# Patient Record
Sex: Male | Born: 1955 | Race: White | Hispanic: No | Marital: Married
Health system: Midwestern US, Community
[De-identification: ages and names within clinical notes are randomized; demographics above are authoritative.]

## PROBLEM LIST (undated history)

## (undated) DIAGNOSIS — R339 Retention of urine, unspecified: Secondary | ICD-10-CM

---

## 2014-01-15 NOTE — Progress Notes (Signed)
PHYSICAL THERAPY - DAILY TREATMENT NOTE    Patient Name: Seth Sandoval        Date: 01/15/2014  DOB: 1955/11/13    Patient DOB Verified: YES  Visit #:   1   of   12  Insurance: Payor: BLUE CROSS / Plan: [place] BL[place] PPO / Product Type: PPO /      In time: 2:00 Out time: 2:45   Total Treatment Time: 45     Medicare Time Tracking (below)   Total Timed Codes (min):  n/a 1:1 Treatment Time:  n/a     TREATMENT AREA/ DIAGNOSIS = Low back pain [724.2]    SUBJECTIVE  Pain Level (on 0 to 10 scale):  0  / 10   Medication Changes/New allergies or changes in medical history, any new surgeries or procedures?    NO    If yes, update Summary List   Subjective Functional Status/Changes:    No changes reported     See Eval       OBJECTIVE  Modalities Rationale:  decrease edema/ inflammation   decrease pain    increase tissue extensibility   increase muscle performance   decrease neural compromise  to improve patient's ability to  perform ADLs    ambulate   perform work   relaxation/ sleep      min  Estim, type/location:                                        att       unatt       w/US       w/ice      w/heat    min   Mechanical Traction: type/lbs                     pro     sup     int     cont      before manual      after manual    min   Ultrasound, settings/location:      min   Iontophoresis w/ dexamethasone, location:                                                 take home patch         in clinic    min   Ice       Heat    location/position:     min   Vasopneumatic Device, press/temp:     min   Other:     Skin assessment post-treatment (if applicable):      intact      redness- no adverse reaction     redness ??? adverse reaction:         10 min Therapeutic Exercise:    See flow sheet   Rationale:   increase ROM    increase strength    increase endurance    increase motor control    other  to improve patient???s ability to  perform ADLs    ambulate   perform work   relaxation/ sleep        min Manual Therapy:   Technique:  STMASTMTPRPROM Stretching  Jt manipulation Gr I  II   III  IV V  Treatment Area:  Other:   Rationale:  decrease pain    decrease TP  increase ROM/ mobility    increase tissue extensibility    decrease edema    reduce disc  postural correction    other     to improve patient's ability to  perform ADLs    ambulate   perform work   relaxation/ sleep       min Therapeutic Activity:   see flow sheet   Rationale: increase ROM    increase strength    increase balance/ proprioception    increase motor control    other   to improve patient???s ability to  perform ADLs    ambulate   perform work   relaxation/ sleep      min Neuromuscular Re-ed:   see flow sheet   Rationale: increase ROM    increase strength    increase balance/ proprioception    increase motor control   improve safety   other    to improve patient???s ability to perform ADLs    ambulate   perform work   relaxation/ sleep                min Gait Training:    To improve patients ability to:     perform ADLs    ambulate       min Patient Education:  Yes     Reviewed HEP     Progressed/Changed HEP based on:        Other Objective/Functional Measures:    See Eval       Post Treatment Pain Level (on 0 to 10) scale:   0  / 10     ASSESSMENT    See Eval      Patient will continue to benefit from skilled PT services to modify and progress therapeutic interventions, address functional mobility deficits, address ROM deficits, address strength deficits, analyze and address soft tissue restrictions, analyze and cue movement patterns, analyze and modify body mechanics/ergonomics and assess and modify postural abnormalities to attain remaining goals.   Progress toward goals / Updated goals:     decr pain   inc stability   inc balance  inc ROM inc strength   centralizing symptoms                     progressing  Function   progressing towards LTGs              progressing HEP       PLAN     Upgrade activities as tolerated YES Continue plan of care     Discharge due to :      Other:      Therapist: Debera Lat, PT    Date: 01/15/2014 Time: 2:56 PM        Future Appointments  Date Time Provider Department Center   01/18/2014 5:00 PM Leverne Humbles, PT [facility]   01/23/2014 5:00 PM Leverne Humbles, PT [facility]   01/25/2014 5:30 PM Leverne Humbles, PT [facility]   01/30/2014 5:00 PM Leverne Humbles, PT [facility]   02/01/2014 5:00 PM Leverne Humbles, PT [facility]   02/05/2014 5:00 PM Debera Lat, PT [facility]   02/07/2014 5:00 PM Debera Lat, PT [facility]   02/12/2014 5:00 PM Debera Lat, PT [facility]   02/14/2014 5:00 PM Debera Lat, PT  [facility]   02/19/2014 5:00 PM Debera Lat, PT [facility]   02/21/2014 5:00 PM Debera Lat, PT [facility] [facility]

## 2014-01-15 NOTE — Progress Notes (Signed)
[facility] ??? [facility]  [address], [place], [place] [id number] - Phone: [phone]  Fax: [phone]  PLAN OF CARE / STATEMENT OF MEDICAL NECESSITY FOR PHYSICAL THERAPY SERVICES  Patient Name: Seth BayleyRobert Sandoval DOB: 10/17/1955   Medical   Diagnosis: Low back pain [724.2] Treatment Diagnosis: L5-S1 Disc Herniation   Onset Date: 7/15     Referral Source: Fenton Foyon, Martin Vt, MD Start of Care [facility](SOC): 01/15/2014   Prior Hospitalization: See medical history Provider #: [id number]   Prior Level of Function: Hx L4-L5 HNP 15 years ago   Comorbidities: HTN   Medications: Verified on Patient Summary List   The Plan of Care and following information is based on the information from the initial evaluation.   ===========================================================================================  Assessment / key information:  Seth Sandoval is a 58 y.o.  yo male with Dx of Low back pain [724.2].  Patient reports onset of symptoms after donning CAM boot for 6 weeks due to right toe fracture.  Patient first experienced right lumbar and LE pain about 3 weeks ago when unable to get out of car due to pain.  Followed up with pain specialist and received MRI revealing disc herniation at L5-S1, per patient.  Patient received first of 2 lumbar epidural steroid injections on 01/04/14 with good symptom relief.  Denies any radiculopathy at this time. Patient currently rates pain as 9/10 at worst, 0/10 at best, primarily located at right lumbar spine.  Patient complains of difficulty and increase pain with standing and walking.  Objective Findings:  Lumbar ROM: Flx = decreased 50% , Ext = decreased 50%, Lat Flx R = 50%, L = 75%, Rot: R = 50%, L = 25%.  Manual Muscle Testing: bilateral LE strength grossly 4+/5.  Special Test: negative SLR and slump test. Oswestry Score=43%.  Pt instructed in HEP, ergonomics, lifting techniques and postural education and will f/u in clinic for PT.   ===========================================================================================  Problem List: pain affecting function, decrease ROM, decrease strength, edema affecting function, impaired gait/ balance, decrease ADL/ functional abilitiies and decrease activity tolerance   Treatment Plan may include any combination of the following: Therapeutic exercise, Therapeutic activities, Neuromuscular re-education, Physical agent/modality, Gait/balance training, Manual therapy and Patient education  Patient / Family readiness to learn indicated by: asking questions, trying to perform skills and interest  Persons(s) to be included in education: patient (P)  Barriers to Learning/Limitations: no  Measures taken:    Patient Goal (s): "Walk without pain. Go back to the gym."   Patient self reported health status: good  Rehabilitation Potential: good  ? Short Term Goals: To be accomplished in  1-2  weeks:  1. Patient will decrease Oswestry Score by 8% to improve activity tolerance.  2. Patient will achieve +2 on GROC to increase activity tolerance.  ? Long Term Goals: To be accomplished in  3-4  weeks:  1.  Patient will achieve +4 on GROC to increase activity tolerance.  2.  Patient will decrease Oswestry Score by 16% to improve activity tolerance.  3.  Patient will report 75% improvement in symptoms to increase walking tolerance.  Frequency / Duration:   Patient to be seen  2-3  times per week for 3-4  weeks:  Patient / Caregiver education and instruction: activity modification and exercises  G-Codes (GP): n/a   Therapist Signature: Debera LatLeah Hilmar Moldovan, MSPT Date: 01/15/2014   Certification Period: n/a Time: 2:58 PM   ===========================================================================================  I certify  that the above Physical Therapy Services are being furnished while the patient is under my care.  I agree with the treatment plan and certify that this therapy is necessary.     Physician Signature:        Date:       Time:     Please sign and return to [facility] or you may fax the signed copy to [phone].  Thank you.

## 2014-01-18 NOTE — Progress Notes (Signed)
PHYSICAL THERAPY - DAILY TREATMENT NOTE    Patient Name: Seth Sandoval        Date: 01/18/2014  DOB: 09-04-1955    Patient DOB Verified: YES  Visit #:   2   of   12  Insurance: Payor: BLUE CROSS / Plan: [place] BLUE [place] OF [place] PPO / Product Type: PPO /      In time: 4:50 Out time: 5:35   Total Treatment Time: 45     Medicare Time Tracking (below)   Total Timed Codes (min):  - 1:1 Treatment Time:  -     TREATMENT AREA/ DIAGNOSIS = Low back pain [724.2]    SUBJECTIVE  Pain Level (on 0 to 10 scale):  3  / 10   Medication Changes/New allergies or changes in medical history, any new surgeries or procedures?    NO    If yes, update Summary List   Subjective Functional Status/Changes:    No changes reported     Functional improvement  No leg pain  Functional limitation  R LBP     OBJECTIVE  Modalities Rationale:  decrease edema/ inflammation   decrease pain    increase tissue extensibility   increase muscle performance   decrease neural compromise  to improve patient's ability to  perform ADLs    ambulate   perform work   relaxation/ sleep      min  Estim, type/location:                                        att       unatt       w/US       w/ice      w/heat    min   Mechanical Traction: type/lbs                     pro     sup     int     cont      before manual      after manual    min   Ultrasound, settings/location:      min   Iontophoresis w/ dexamethasone, location:                                                 take home patch         in clinic    min   Ice       Heat    location/position:     min   Vasopneumatic Device, press/temp:     min   Other:     Skin assessment post-treatment (if applicable):      intact      redness- no adverse reaction     redness ??? adverse reaction:        15  min Therapeutic Exercise:    See flow sheet   Rationale:   increase ROM    increase strength    increase endurance    increase motor control    other  to improve patient???s ability to  perform ADLs    ambulate   perform work    relaxation/ sleep      15 min Manual Therapy: Technique:  DTM to L/S and PA mobs (gd IV to T/S), OP with REIL    Other:   Rationale:  decrease pain    decrease TP  increase ROM/ mobility    increase tissue extensibility    decrease edema    reduce disc  postural correction    other     to improve patient's ability to  perform ADLs    ambulate   perform work   relaxation/ sleep      15 min Therapeutic Activity:   education on anatomy/physiology and posture/body mechanics   Rationale: increase ROM    increase strength    increase balance/ proprioception    increase motor control    other   to improve patient???s ability to  perform ADLs    ambulate   perform work   relaxation/ sleep      min Neuromuscular Re-ed:   see flow sheet   Rationale: increase ROM    increase strength    increase balance/ proprioception    increase motor control   improve safety   other    to improve patient???s ability to perform ADLs    ambulate   perform work   relaxation/ sleep                min Gait Training:    To improve patients ability to:     perform ADLs    ambulate       min Patient Education:  Yes     Reviewed HEP     Progressed/Changed HEP based on:        Other Objective/Functional Measures:  Today, pt with R LBP and no LE pain  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   Progress toward goals / Updated goals:     decr pain   inc stability   inc balance  inc ROM inc strength    centralizing symptoms                     progressing  Function   progressing towards LTGs              progressing HEP       PLAN    Upgrade activities as tolerated YES Continue plan of care     Discharge due to :      Other:      Therapist: Leverne Humbles, PT    Date: 01/18/2014 Time: 6:11 PM        Future Appointments  Date Time Provider Department Center   01/23/2014 5:00 PM Leverne Humbles, PT [facility]   01/26/2014 12:00 PM Leverne Humbles, PT [facility]   01/30/2014 5:00 PM Leverne Humbles, PT [facility]   02/01/2014 5:00 PM Leverne Humbles, PT [facility]   02/05/2014 5:00 PM Debera Lat, PT [facility]   02/07/2014 5:00 PM Debera Lat, PT [facility]   02/12/2014  5:00 PM Debera Lat, PT [facility]   02/14/2014 5:00 PM Debera Lat, PT [facility]   02/19/2014 5:00 PM Debera Lat, PT [facility]   02/21/2014 5:00 PM Debera Lat, PT [facility]

## 2014-01-23 NOTE — Progress Notes (Signed)
PHYSICAL THERAPY - DAILY TREATMENT NOTE    Patient Name: Seth Sandoval        Date: 01/23/2014  DOB: Sep 30, 1955    Patient DOB Verified: YES  Visit #:   3   of   12  Insurance: Payor: BLUE CROSS / Plan: [place] BL[place] PPO / Product Type: PPO /      In time: 5 Out time: 5:30   Total Treatment Time: 30     Medicare Time Tracking (below)   Total Timed Codes (min):  - 1:1 Treatment Time:  -     TREATMENT AREA/ DIAGNOSIS = Low back pain [724.2]    SUBJECTIVE  Pain Level (on 0 to 10 scale):  0  / 10   Medication Changes/New allergies or changes in medical history, any new surgeries or procedures?    NO    If yes, update Summary List   Subjective Functional Status/Changes:    No changes reported     Functional improvement i feel good   Functional limitation-      OBJECTIVE  Modalities Rationale:  decrease edema/ inflammation   decrease pain    increase tissue extensibility   increase muscle performance   decrease neural compromise  to improve patient's ability to  perform ADLs    ambulate   perform work   relaxation/ sleep      min  Estim, type/location:                                        att       unatt       w/US       w/ice      w/heat    min   Mechanical Traction: type/lbs                     pro     sup     int     cont      before manual      after manual    min   Ultrasound, settings/location:      min   Iontophoresis w/ dexamethasone, location:                                                 take home patch         in clinic    min   Ice       Heat    location/position:     min   Vasopneumatic Device, press/temp:     min   Other:     Skin assessment post-treatment (if applicable):      intact      redness- no adverse reaction     redness ??? adverse reaction:        15  min Therapeutic Exercise:    See flow sheet   Rationale:   increase ROM    increase strength    increase endurance    increase motor control    other  to improve patient???s ability to  perform ADLs    ambulate   perform work    relaxation/ sleep     10  min Manual Therapy: Technique:  DTM to L/S and T/S, Gd IV PA mobs to lower/mid T/S  Other:   Rationale:  decrease pain    decrease TP  increase ROM/ mobility    increase tissue extensibility    decrease edema    reduce disc  postural correction    other     to improve patient's ability to  perform ADLs    ambulate   perform work   relaxation/ sleep      5 min Therapeutic Activity:   posture/body mechanics training   Rationale: increase ROM    increase strength    increase balance/ proprioception    increase motor control    other   to improve patient???s ability to  perform ADLs    ambulate   perform work   relaxation/ sleep      min Neuromuscular Re-ed:   see flow sheet   Rationale: increase ROM    increase strength    increase balance/ proprioception    increase motor control   improve safety   other    to improve patient???s ability to perform ADLs    ambulate   perform work   relaxation/ sleep                min Gait Training:    To improve patients ability to:     perform ADLs    ambulate       min Patient Education:  Yes     Reviewed HEP     Progressed/Changed HEP based on:        Other Objective/Functional Measures:    No LBP today  Felt great after last treatment, able to dance at a wedding on saturday   Post Treatment Pain Level (on 0 to 10) scale:   0  / 10     ASSESSMENT    See Progress Note/Recertification      Patient will continue to benefit from skilled PT services to modify and progress therapeutic interventions, address functional mobility deficits, address ROM deficits and address strength deficits to attain remaining goals.   Progress toward goals / Updated goals:     decr pain   inc stability   inc balance  inc ROM inc strength   centralizing symptoms                     progressing  Function   progressing towards LTGs              progressing HEP       PLAN    Upgrade activities as tolerated YES Continue plan of care     Discharge due to :      Other:       Therapist: Leverne Humbles, PT    Date: 01/23/2014 Time: 5:35 PM        Future Appointments  Date Time Provider Department Center   01/30/2014 5:00 PM Leverne Humbles, PT [facility]   02/01/2014 5:00 PM Leverne Humbles, PT [facility]   02/05/2014 5:00 PM Debera Lat, PT [facility]   02/07/2014 5:00 PM Debera Lat, PT [facility]   02/12/2014 5:00 PM Debera Lat, PT [facility]   02/14/2014 5:00 PM Debera Lat, PT [facility]   02/19/2014 5:00 PM Debera Lat, PT [facility]   02/21/2014 5:00 PM Debera Lat, PT [facility]

## 2014-02-01 NOTE — Progress Notes (Signed)
PHYSICAL THERAPY - DAILY TREATMENT NOTE    Patient Name: Seth Sandoval        Date: 02/01/2014  DOB: 1956-04-14    Patient DOB Verified: YES  Visit #:   4   of   12  Insurance: Payor: BLUE CROSS / Plan: [place] / Product Type: PPO /      In time: 5 Out time: 5:40   Total Treatment Time: 40     Medicare Time Tracking (below)   Total Timed Codes (min):  - 1:1 Treatment Time:  -     TREATMENT AREA/ DIAGNOSIS = Low back pain [724.2]    SUBJECTIVE  Pain Level (on 0 to 10 scale):  0  / 10   Medication Changes/New allergies or changes in medical history, any new surgeries or procedures?    NO    If yes, update Summary List   Subjective Functional Status/Changes:    No changes reported     Functional improvement i feel pretty good   Functional limitation       OBJECTIVE  Modalities Rationale:  decrease edema/ inflammation   decrease pain    increase tissue extensibility   increase muscle performance   decrease neural compromise  to improve patient's ability to  perform ADLs    ambulate   perform work   relaxation/ sleep      min  Estim, type/location:                                        att       unatt       w/US       w/ice      w/heat    min   Mechanical Traction: type/lbs                     pro     sup     int     cont      before manual      after manual    min   Ultrasound, settings/location:      min   Iontophoresis w/ dexamethasone, location:                                                 take home patch         in clinic    min   Ice       Heat    location/position:     min   Vasopneumatic Device, press/temp:     min   Other:     Skin assessment post-treatment (if applicable):      intact      redness- no adverse reaction     redness ??? adverse reaction:        30  min Therapeutic Exercise:    See flow sheet   Rationale:   increase ROM    increase strength    increase endurance    increase motor control    other  to improve patient???s ability to  perform ADLs    ambulate   perform work    relaxation/ sleep     10 min Manual Therapy: Technique:  DTM to L/S and T/S, Gd iV PA mobs to T/S  Other:   Rationale:  decrease pain    decrease TP  increase ROM/ mobility    increase tissue extensibility    decrease edema    reduce disc  postural correction    other     to improve patient's ability to  perform ADLs    ambulate   perform work   relaxation/ sleep       min Therapeutic Activity:   see flow sheet   Rationale: increase ROM    increase strength    increase balance/ proprioception    increase motor control    other   to improve patient???s ability to  perform ADLs    ambulate   perform work   relaxation/ sleep      min Neuromuscular Re-ed:   see flow sheet   Rationale: increase ROM    increase strength    increase balance/ proprioception    increase motor control   improve safety   other    to improve patient???s ability to perform ADLs    ambulate   perform work   relaxation/ sleep                min Gait Training:    To improve patients ability to:     perform ADLs    ambulate       min Patient Education:  Yes     Reviewed HEP     Progressed/Changed HEP based on:        Other Objective/Functional Measures:    Though his MRI seems to show radic symptoms were caused by stenosis, and abolished with ESI, the patient is tolerating full L/S extension (REIL) without any pain  Educated on posture with therex, to elongate his core while standing   Post Treatment Pain Level (on 0 to 10) scale:   0  / 10     ASSESSMENT    See Progress Note/Recertification      Patient will continue to benefit from skilled PT services to modify and progress therapeutic interventions, address functional mobility deficits, address ROM deficits and address strength deficits to attain remaining goals.   Progress toward goals / Updated goals:     decr pain   inc stability   inc balance  inc ROM inc strength   centralizing symptoms                     progressing  Function   progressing towards LTGs              progressing HEP        PLAN    Upgrade activities as tolerated YES Continue plan of care     Discharge due to :      Other:      Therapist: Leverne Humbles, PT    Date: 02/01/2014 Time: 6:37 PM        Future Appointments  Date Time Provider Department Center   02/05/2014 5:00 PM Debera Lat, PT [facility]   02/07/2014 5:00 PM Debera Lat, PT [facility]   02/12/2014 5:00 PM Debera Lat, PT [facility]   02/14/2014 5:00 PM Debera Lat, PT [facility]   02/19/2014 5:00 PM Debera Lat, PT [facility]   02/21/2014 5:00 PM Debera Lat, PT [facility]

## 2014-02-05 NOTE — Progress Notes (Signed)
PHYSICAL THERAPY - DAILY TREATMENT NOTE    Patient Name: Seth Sandoval        Date: 02/05/2014  DOB: 1956-01-29    Patient DOB Verified: YES  Visit #:   5   of   12  Insurance: Payor: BLUE CROSS / Plan: [place]  PPO / Product Type: PPO /      In time: 5:05 Out time: 5:45   Total Treatment Time: 40     Medicare Time Tracking (below)   Total Timed Codes (min):  n/a 1:1 Treatment Time:  n/a     TREATMENT AREA/ DIAGNOSIS = Low back pain [724.2]    SUBJECTIVE  Pain Level (on 0 to 10 scale):  0  / 10   Medication Changes/New allergies or changes in medical history, any new surgeries or procedures?    NO    If yes, update Summary List   Subjective Functional Status/Changes:    No changes reported     Functional Improvement:ADLs painfree  Functional Limitation:higher impact activities       OBJECTIVE  Modalities Rationale:  decrease edema/ inflammation   decrease pain    increase tissue extensibility   increase muscle performance   decrease neural compromise  to improve patient's ability to  perform ADLs    ambulate   perform work   relaxation/ sleep      min  Estim, type/location:                                        att       unatt       w/US       w/ice      w/heat    min   Mechanical Traction: type/lbs                     pro     sup     int     cont      before manual      after manual    min   Ultrasound, settings/location:      min   Iontophoresis w/ dexamethasone, location:                                                 take home patch         in clinic    min   Ice       Heat    location/position:     min   Vasopneumatic Device, press/temp:     min   Other:     Skin assessment post-treatment (if applicable):      intact      redness- no adverse reaction     redness ??? adverse reaction:         15 min Therapeutic Exercise:    See flow sheet   Rationale:   increase ROM    increase strength    increase endurance    increase motor control    other   to improve patient???s ability to  perform ADLs    ambulate   perform work   relaxation/ sleep      10 min Manual Therapy: Technique:  STMASTMTPRPROM Stretching  Jt manipulation Gr I  II   III  IV V  Treatment Area: Aug STM lumbar paraspinals; Prone T/S PA mobs grade III   Rationale:  decrease pain    decrease TP  increase ROM/ mobility    increase tissue extensibility    decrease edema    reduce disc  postural correction    other     to improve patient's ability to  perform ADLs    ambulate   perform work   relaxation/ sleep       min Therapeutic Activity:   see flow sheet   Rationale: increase ROM    increase strength    increase balance/ proprioception    increase motor control    other   to improve patient???s ability to  perform ADLs    ambulate   perform work   relaxation/ sleep     15 min Neuromuscular Re-ed:   see flow sheet   Rationale: increase ROM    increase strength    increase balance/ proprioception    increase motor control   improve safety   other    to improve patient???s ability to perform ADLs    ambulate   perform work   relaxation/ sleep                min Gait Training:    To improve patients ability to:     perform ADLs    ambulate       min Patient Education:  Yes     Reviewed HEP     Progressed/Changed HEP based on:        Other Objective/Functional Measures:    Patient reporting no complaints of pain with ADLs.  Slowly increasing weight training at gym without symptom exacerbation.   Post Treatment Pain Level (on 0 to 10) scale:   0  / 10     ASSESSMENT    See Progress Note/Recertification      Patient will continue to benefit from skilled PT services to modify and progress therapeutic interventions, address functional mobility deficits, address ROM deficits, address strength deficits, analyze and address soft tissue restrictions, analyze and cue movement patterns, analyze and modify body mechanics/ergonomics and assess and modify postural abnormalities to attain remaining goals.    Progress toward goals / Updated goals:      decr pain   inc stability   inc balance  inc ROM inc strength   centralizing symptoms                     progressing  Function   progressing towards LTGs              progressing HEP       PLAN    Upgrade activities as tolerated YES Continue plan of care     Discharge due to :      Other:      Therapist: Debera Lat, PT    Date: 02/05/2014 Time: 5:37 PM        Future Appointments  Date Time Provider Department Center   02/07/2014 5:00 PM Debera Lat, PT [facility]   02/12/2014 5:00 PM Debera Lat, PT [facility]   02/14/2014 5:00 PM Debera Lat, PT [facility]   02/19/2014 5:00 PM Debera Lat, PT [facility]   02/21/2014 5:00 PM Debera Lat, PT [facility]

## 2014-02-08 NOTE — Progress Notes (Signed)
PHYSICAL THERAPY - DAILY TREATMENT NOTE    Patient Name: Seth Sandoval        Date: 02/08/2014  DOB: 08-29-1955    Patient DOB Verified: YES  Visit #:   6   of   12  Insurance: Payor: BLUE CROSS / Plan: [place] PPO / Product Type: PPO /      In time: 3:35 Out time: 4:20   Total Treatment Time: 45     Medicare Time Tracking (below)   Total Timed Codes (min):  - 1:1 Treatment Time:  -     TREATMENT AREA/ DIAGNOSIS = Low back pain [724.2]    SUBJECTIVE  Pain Level (on 0 to 10 scale):  0  / 10   Medication Changes/New allergies or changes in medical history, any new surgeries or procedures?    NO    If yes, update Summary List   Subjective Functional Status/Changes:    No changes reported     Functional improvement no pain lately, even working    Functional limitation       OBJECTIVE  Modalities Rationale:  decrease edema/ inflammation   decrease pain    increase tissue extensibility   increase muscle performance   decrease neural compromise  to improve patient's ability to  perform ADLs    ambulate   perform work   relaxation/ sleep      min  Estim, type/location:                                        att       unatt       w/US       w/ice      w/heat    min   Mechanical Traction: type/lbs                     pro     sup     int     cont      before manual      after manual    min   Ultrasound, settings/location:      min   Iontophoresis w/ dexamethasone, location:                                                 take home patch         in clinic    min   Ice       Heat    location/position:     min   Vasopneumatic Device, press/temp:     min   Other:     Skin assessment post-treatment (if applicable):      intact      redness- no adverse reaction     redness ??? adverse reaction:        45  min Therapeutic Exercise:    See flow sheet   Rationale:   increase ROM    increase strength    increase endurance    increase motor control    other   to improve patient???s ability to  perform ADLs    ambulate   perform work   relaxation/ sleep       min Manual Therapy: Technique:  STMASTMTPRPROM Stretching  Jt manipulation Gr I  II   III  IV V  Treatment Area:    Other:   Rationale:  decrease pain    decrease TP  increase ROM/ mobility    increase tissue extensibility    decrease edema    reduce disc  postural correction    other     to improve patient's ability to  perform ADLs    ambulate   perform work   relaxation/ sleep       min Therapeutic Activity:   see flow sheet   Rationale: increase ROM    increase strength    increase balance/ proprioception    increase motor control    other   to improve patient???s ability to  perform ADLs    ambulate   perform work   relaxation/ sleep      min Neuromuscular Re-ed:   see flow sheet   Rationale: increase ROM    increase strength    increase balance/ proprioception    increase motor control   improve safety   other    to improve patient???s ability to perform ADLs    ambulate   perform work   relaxation/ sleep                min Gait Training:    To improve patients ability to:     perform ADLs    ambulate       min Patient Education:  Yes     Reviewed HEP     Progressed/Changed HEP based on:        Other Objective/Functional Measures:    No pain lately  Doing well at gym  Added lifts and chops for core stability  Tolerating full L/S ext without symptoms    Post Treatment Pain Level (on 0 to 10) scale:   0  / 10     ASSESSMENT    See Progress Note/Recertification      Patient will continue to benefit from skilled PT services to modify and progress therapeutic interventions, address functional mobility deficits, address ROM deficits, address strength deficits and analyze and address soft tissue restrictions to attain remaining goals.   Progress toward goals / Updated goals:     decr pain   inc stability   inc balance  inc ROM inc strength   centralizing symptoms                     progressing  Function    progressing towards LTGs              progressing HEP       PLAN    Upgrade activities as tolerated YES Continue plan of care     Discharge due to :      Other:      Therapist: Leverne Humbles, PT    Date: 02/08/2014 Time: 4:50 PM        Future Appointments  Date Time Provider Department Center   02/12/2014 5:00 PM Debera Lat, PT [facility]   02/14/2014 5:00 PM Debera Lat, PT [facility]   02/19/2014 5:00 PM Debera Lat, PT [facility]   02/21/2014 5:00 PM Debera Lat, PT [facility]

## 2014-02-12 NOTE — Progress Notes (Signed)
PHYSICAL THERAPY - DAILY TREATMENT NOTE    Patient Name: Seth Sandoval        Date: 02/12/2014  DOB: 10/24/1955    Patient DOB Verified: YES  Visit #:   7   of   12  Insurance: Payor: BLUE CROSS / Plan: [place]UE [place] PPO / Product Type: PPO /      In time: 5:00 Out time: 5:45   Total Treatment Time: 45     Medicare Time Tracking (below)   Total Timed Codes (min):  n/a 1:1 Treatment Time:  n/a     TREATMENT AREA/ DIAGNOSIS = Low back pain [724.2]    SUBJECTIVE  Pain Level (on 0 to 10 scale):  0  / 10   Medication Changes/New allergies or changes in medical history, any new surgeries or procedures?    NO    If yes, update Summary List   Subjective Functional Status/Changes:    No changes reported     Functional Improvement:painfree ADLs  Functional Limitation:gym routine       OBJECTIVE  Modalities Rationale:  decrease edema/ inflammation   decrease pain    increase tissue extensibility   increase muscle performance   decrease neural compromise  to improve patient's ability to  perform ADLs    ambulate   perform work   relaxation/ sleep      min  Estim, type/location:                                        att       unatt       w/US       w/ice      w/heat    min   Mechanical Traction: type/lbs                     pro     sup     int     cont      before manual      after manual    min   Ultrasound, settings/location:      min   Iontophoresis w/ dexamethasone, location:                                                 take home patch         in clinic    min   Ice       Heat    location/position:     min   Vasopneumatic Device, press/temp:     min   Other:     Skin assessment post-treatment (if applicable):      intact      redness- no adverse reaction     redness ??? adverse reaction:         15 min Therapeutic Exercise:    See flow sheet   Rationale:   increase ROM    increase strength    increase endurance    increase motor control    other   to improve patient???s ability to  perform ADLs    ambulate   perform work   relaxation/ sleep       min Manual Therapy: Technique:  STMASTMTPRPROM Stretching  Jt manipulation Gr I  II   III  IV V  Treatment Area:  Other:   Rationale:  decrease pain    decrease TP  increase ROM/ mobility    increase tissue extensibility    decrease edema    reduce disc  postural correction    other     to improve patient's ability to  perform ADLs    ambulate   perform work   relaxation/ sleep       min Therapeutic Activity:   see flow sheet   Rationale: increase ROM    increase strength    increase balance/ proprioception    increase motor control    other   to improve patient???s ability to  perform ADLs    ambulate   perform work   relaxation/ sleep     30 min Neuromuscular Re-ed:   see flow sheet   Rationale: increase ROM    increase strength    increase balance/ proprioception    increase motor control   improve safety   other    to improve patient???s ability to perform ADLs    ambulate   perform work   relaxation/ sleep                min Gait Training:    To improve patients ability to:     perform ADLs    ambulate       min Patient Education:  Yes     Reviewed HEP     Progressed/Changed HEP based on:        Other Objective/Functional Measures:  Focus on mobility exercises and proper form for gym routine. Reviewed squats and plank. Encouraged D/C elastic back brace.  Added Kensio tape and lumbar spine for feedback with poor posture. Updated HEP to include increased spinal mobility exercises. Discussed D/C next visit if progressing well with gym program.   Post Treatment Pain Level (on 0 to 10) scale:   0  / 10     ASSESSMENT    See Progress Note/Recertification      Patient will continue to benefit from skilled PT services to modify and progress therapeutic interventions, address functional mobility deficits, address ROM deficits, address strength deficits, analyze and address soft  tissue restrictions, analyze and cue movement patterns, analyze and modify body mechanics/ergonomics and assess and modify postural abnormalities to attain remaining goals.   Progress toward goals / Updated goals:      decr pain   inc stability   inc balance  inc ROM inc strength   centralizing symptoms                     progressing  Function   progressing towards LTGs              progressing HEP       PLAN    Upgrade activities as tolerated YES Continue plan of care     Discharge due to :      Other:      Therapist: Debera Lat, PT    Date: 02/12/2014 Time: 5:49 PM        Future Appointments  Date Time Provider Department Center   02/19/2014 5:00 PM Debera Lat, PT [facility]   02/21/2014 5:00 PM Debera Lat, PT [facility]

## 2014-02-19 NOTE — Progress Notes (Signed)
[facility] PHYSICAL THERAPY AT [facility]  [address] [place] [address]   Phone: [phone]  Fax: [phone]  DISCHARGE SUMMARY  Patient Name: KALDEN WANKE DOB: 01/18/1956   Treatment/Medical Diagnosis: Low back pain [724.2]   Referral Source: Woodfin Ganja, MD     Date of Initial Visit: 01/15/14 Attended Visits: 8 Missed Visits: 1     SUMMARY OF TREATMENT  Treatment consisting of manual therapy and therex to improve back pain.  CURRENT STATUS  Patient progressing well.  No complaints of back pain or radiculopathy. Patient demonstrating good body mechanics and slowly returning to modified gym routine. Discharge to HEP to maintain functional improvements.    Goal/Measure of Progress Goal Met?   1.  +4 GROC   Status at last Eval: n/a Current Status: +6 yes   2.  Oswestry decrease by 16%   Status at last Eval: 43% Current Status: 4% yes   3.  75% improvement in symptoms   Status at last Eval: n/a Current Status: 90% improved yes   4.  n/a   Status at last Eval: n/a Current Status: n/a n/a     RECOMMENDATIONS  Discontinue therapy. Progressing towards or have reached established goals.  If you have any questions/comments please contact us directly at [phone].   Thank you for allowing [place] to assist in the care of your patient.    Therapist Signature: Otho Bellows, PT Date: 02/19/14     Time: 5:44 PM

## 2014-02-19 NOTE — Progress Notes (Signed)
PHYSICAL THERAPY - DAILY TREATMENT NOTE    Patient Name: Seth Sandoval        Date: 02/19/2014  DOB: 1956-05-09    Patient DOB Verified: YES  Visit #:   8   of   8  Insurance: Payor: BLUE CROSS / Plan: [facility] [place] [place] [facility] [place] PPO / Product Type: PPO /      In time: 5:00 Out time: 5:30   Total Treatment Time: 30     Medicare Time Tracking (below)   Total Timed Codes (min):  n/a 1:1 Treatment Time:  n/a     TREATMENT AREA/ DIAGNOSIS = Low back pain [724.2]    SUBJECTIVE  Pain Level (on 0 to 10 scale):  0  / 10   Medication Changes/New allergies or changes in medical history, any new surgeries or procedures?    NO    If yes, update Summary List   Subjective Functional Status/Changes:    No changes reported     Functional Improvement:ADLs  Functional Limitation:heavy lifting       OBJECTIVE  Modalities Rationale:  decrease edema/ inflammation   decrease pain    increase tissue extensibility   increase muscle performance   decrease neural compromise  to improve patient's ability to  perform ADLs    ambulate   perform work   relaxation/ sleep      min  Estim, type/location:                                        att       unatt       w/US       w/ice      w/heat    min   Mechanical Traction: type/lbs                     pro     sup     int     cont      before manual      after manual    min   Ultrasound, settings/location:      min   Iontophoresis w/ dexamethasone, location:                                                 take home patch         in clinic    min   Ice       Heat    location/position:     min   Vasopneumatic Device, press/temp:     min   Other:     Skin assessment post-treatment (if applicable):      intact      redness- no adverse reaction     redness ??? adverse reaction:         30 min Therapeutic Exercise:    See flow sheet   Rationale:   increase ROM    increase strength    increase endurance    increase motor control    other  to improve patient???s ability to  perform ADLs    ambulate   perform work    relaxation/ sleep       min Manual Therapy: Technique:  STMASTMTPRPROM Stretching  Jt manipulation Gr I  II   III  IV V  Treatment Area:  Other:   Rationale:  decrease pain    decrease TP  increase ROM/ mobility    increase tissue extensibility    decrease edema    reduce disc  postural correction    other     to improve patient's ability to  perform ADLs    ambulate   perform work   relaxation/ sleep       min Therapeutic Activity:   see flow sheet   Rationale: increase ROM    increase strength    increase balance/ proprioception    increase motor control    other   to improve patient???s ability to  perform ADLs    ambulate   perform work   relaxation/ sleep      min Neuromuscular Re-ed:   see flow sheet   Rationale: increase ROM    increase strength    increase balance/ proprioception    increase motor control   improve safety   other    to improve patient???s ability to perform ADLs    ambulate   perform work   relaxation/ sleep                min Gait Training:    To improve patients ability to:     perform ADLs    ambulate       min Patient Education:  Yes     Reviewed HEP     Progressed/Changed HEP based on:        Other Objective/Functional Measures:  See D/C Summary.  Pain free lumbar AROM, limited 50% lumbar sidebending bilaterally.   Post Treatment Pain Level (on 0 to 10) scale:   0  / 10     ASSESSMENT    See Progress Note/Recertification      Patient will continue to benefit from skilled PT services to instruct in home and community integration to attain remaining goals.   Progress toward goals / Updated goals:      decr pain   inc stability   inc balance  inc ROM inc strength   centralizing symptoms                     progressing  Function   progressing towards LTGs              progressing HEP       PLAN    Upgrade activities as tolerated YES Continue plan of care     Discharge due to : Progress toward goals     Other:      Therapist: Debera Lat, PT    Date: 02/19/2014 Time: 5:38 PM         Future Appointments  Date Time Provider Department Center   02/21/2014 5:00 PM Debera Lat, PT [facility]

## 2015-10-15 LAB — PSA, DIAGNOSTIC (PROSTATE SPECIFIC AG): Prostate Specific Ag: 2.5 ng/mL (ref 0.0–4.0)

## 2016-06-03 ENCOUNTER — Ambulatory Visit
Admit: 2016-06-03 | Discharge: 2016-06-03 | Payer: PRIVATE HEALTH INSURANCE | Attending: Urology | Primary: Student in an Organized Health Care Education/Training Program

## 2016-06-03 DIAGNOSIS — R339 Retention of urine, unspecified: Secondary | ICD-10-CM

## 2016-06-03 LAB — AMB POC URINALYSIS DIP STICK AUTO W/O MICRO
Bilirubin (UA POC): NEGATIVE
Glucose (UA POC): NEGATIVE
Ketones (UA POC): NEGATIVE
Nitrites (UA POC): NEGATIVE
Specific gravity (UA POC): 1.025 (ref 1.001–1.035)
Urobilinogen (UA POC): 0.2 (ref 0.2–1)
pH (UA POC): 5.5 (ref 4.6–8.0)

## 2016-06-03 LAB — AMB POC PVR, MEAS,POST-VOID RES,US,NON-IMAGING: PVR: 459 cc

## 2016-06-03 NOTE — Progress Notes (Signed)
Edson SnowballRobert J Wollschlager is a 61 y.o. male is here for a catheter change per Dr. Allena EaringeLong order.    Dr. Allena EaringeLong was present in the clinic as incident to.     There were no vitals taken for this visit.     Procedure:   The genital and perineal areas were cleansed with antiseptic solution.  With clean gloves, I applied sterile lubricant liberally to the catheter tip, lubricating at least six inches of the catheter. Antiseptic solution was used on the cotton balls.  The entire glans was cleansed again.   The penis was held at 90-degree angle. The catheter was advanced into the meatus.  The sphincter was allowed to relax. The penis was lowered and the catheter was advanced.  Balloon was inflated using 10cc of sterile water.   Catheter was connected to drainage system.    Follow up appointment was made.      Catheter size:  24F  Type:  Coude  Material:  Latex    Urine was:   Cloudy    Specimen was obtained:   Yes  Drainage bag:   Leg and Night    Encounter Diagnosis   Name Primary?   ??? Urinary retention Yes       Orders Placed This Encounter   ??? Foley Insert /Change Simple   ??? AMB POC URINALYSIS DIP STICK AUTO W/O MICRO       Tomma RakersPatricia K Inell Mimbs, LPN

## 2016-06-03 NOTE — Progress Notes (Signed)
ASSESSMENT:     06/03/2016    Encounter Diagnoses     ICD-10-CM ICD-9-CM   1. Urinary retention R33.9 788.20   2. Benign prostatic hyperplasia with incomplete bladder emptying N40.1 600.01    R39.14 788.21       PLAN:       RTC 1 week for VT, am appt with [facility] then 1 month with me, PVR and uroflow; sooner if difficulty  All questions answered.    A copy of today's note will be sent to the patient's primary care physician, PROVIDER UNKNOWN Thank you for the opportunity to participate in the care of this nice patient.      SUBJECTIVE:     CC:   Chief Complaint   Patient presents with   ??? Urinary Retention     Patient states that on 1/6/18He had urge to urinate and was unable to. Per PCP took Azo and increased fluid intake was still unable to urinate went to ER and they inserted catheter drained 1200. PCP removed catheter on 06/02/16 he still feels like he isnt urinating well.        HPI:    Seth Sandoval is a 61 y.o. male  who presents for work up and management of urinary retention.    He was seen in the ER when he felt he could not urinate and foley was placed without difficulty with return of 1200cc urine. 3 days later the foley was removed and the patient now feels that he is not urinating well. He has sensation of incomplete bladder emptying and dribbling stream. Prior to this episode was having some nocturia and decreased FOS but "not like this". He was started on flomax nightly which has subsequently been doubled, as well as an antibiotic. No fevers or chills. He tolerated the catheter poorly.    Similar episode about a decade ago but self resolved at that time  Has had PSA done with PCP and has been normal per patient. No recent DRE    No flowsheet data found.    Past Medical History:   Diagnosis Date   ??? Herniated disc, cervical    ??? High cholesterol    ??? Hypertension    ??? Urinary retention      History reviewed. No pertinent surgical history.  Current Outpatient Prescriptions    Medication Sig Dispense Refill   ??? atorvastatin (LIPITOR) 40 mg tablet take 1 tablet by mouth at bedtime for cholesterol  0   ??? dexamethasone (DECADRON) 4 mg tablet   0   ??? lisinopril (PRINIVIL, ZESTRIL) 40 mg tablet take 1 tablet by mouth daily  0   ??? metFORMIN (GLUCOPHAGE) 1,000 mg tablet   0   ??? minocycline (DYNACIN) 100 mg tablet   0   ??? tamsulosin (FLOMAX) 0.4 mg capsule take 1 capsule by mouth daily AFTER EVENING MEAL FOR URINE FLOW  0     Allergies   Allergen Reactions   ??? Iodine Rash     Social History     Social History   ??? Marital status: UNKNOWN     Spouse name: N/A   ??? Number of children: N/A   ??? Years of education: N/A     Occupational History   ??? Not on file.     Social History Main Topics   ??? Smoking status: Never Smoker   ??? Smokeless tobacco: Never Used   ??? Alcohol use Yes   ??? Drug use: No   ??? Sexual  activity: No     Other Topics Concern   ??? Not on file     Social History Narrative   ??? No narrative on file      Family History   Problem Relation Age of Onset   ??? Family history unknown: Yes         Review of Systems  Constitutional: Fever: No  Skin: Rash: No  HEENT: Hearing difficulty: No  Eyes: Blurred vision: No  Cardiovascular: Chest pain: No  Respiratory: Shortness of breath: No  Gastrointestinal: Nausea/vomiting: No  Musculoskeletal: Back pain: No  Neurological: Weakness: No  Psychological: Memory loss: No  Comments/additional findings:       OBJECTIVE:     PHYSICAL EXAM:  Vitals:    06/03/16 1525   BP: 136/78   Weight: 245 lb (111.1 kg)   Height: 6' (1.829 m)      Body mass index is 33.23 kg/(m^2).   General: alert, oriented to person, place and time, no acute distress and no anxiety, depression or agitation  Chest: respiratory effort normal  Cardiovascular:  no peripheral edema noted  Abdomen: soft  Back: negative  GU:     Prostate: smooth and symmetric without nodules or tenderness, enlarged 2+  Extremities: extremities normal, atraumatic, no cyanosis or edema  Neuro: alert, oriented x3       Review of Labs, Medical Images, tracings or specimens:    Results for orders placed or performed in visit on 06/03/16   AMB POC URINALYSIS DIP STICK AUTO W/O MICRO   Result Value Ref Range    Color (UA POC) Yellow     Clarity (UA POC) Clear     Glucose (UA POC) Negative Negative    Bilirubin (UA POC) Negative Negative    Ketones (UA POC) Negative Negative    Specific gravity (UA POC) 1.025 1.001 - 1.035    Blood (UA POC) 4+ Negative    pH (UA POC) 5.5 4.6 - 8.0    Protein (UA POC) 1+ Negative    Urobilinogen (UA POC) 0.2 mg/dL 0.2 - 1    Nitrites (UA POC) Negative Negative    Leukocyte esterase (UA POC) Trace Negative   AMB POC PVR, MEAS,POST-VOID RES,US,NON-IMAGING   Result Value Ref Range    PVR 459 cc           Theressa MillardJessica Almira Phetteplace, MD  [facility]  A Division of [facility] [place]

## 2016-06-05 LAB — URINE C&S

## 2016-06-10 ENCOUNTER — Institutional Professional Consult (permissible substitution)
Admit: 2016-06-10 | Discharge: 2016-06-10 | Payer: PRIVATE HEALTH INSURANCE | Primary: Student in an Organized Health Care Education/Training Program

## 2016-06-10 DIAGNOSIS — R339 Retention of urine, unspecified: Secondary | ICD-10-CM

## 2016-06-10 NOTE — Progress Notes (Signed)
Patient here for voiding trial per Dr. Allena Earingelong order.   Dr. Winnifred Friarobey is present in the clinic as incident to.     Visit Vitals   ??? Ht 6' (1.829 m)   ??? Wt 245 lb (111.1 kg)   ??? BMI 33.23 kg/m2        Procedure:    Patient was placed in supine position.  I have visualized the urine in the bag and recorded it as being clear  Under clean technique, 120 ccs of sterile water was instilled into patient's bladder via catheter/syringe at gravity.   Patient is advised that there will be a slight burning during removal of the catheter.  The balloon was emptied by inserting the barrel of the syringe and withdrawing the amount of fluid used during inflation. The catheter was gently grasped and removed without difficulty. The area was examined and no skin breakdown was noted.    Patient voided 100 ccs of clear urine.    Patient instructed to either return to our office or call our office back by 2 pm to let us know how He is doing/voiding.   I spent approximately 15 minutes with the patient today explaining the importance of notifying our office if He is unable to void. Patient also knows to report any symptoms of fever, chills, urinary frequency, burning, dribbling, hesitation in starting the stream of urine as well as cloudiness or any other unusual color or characteristic of the urine to our office.  Patient is made fully aware that if they are unable to void that they will need to return to our office prior to the close of business today or to the ER if they are unable to void after hours.  Patient verbalized understanding of the above.  Questions were answered.        Patient called back later and stated that he is voiding.    Orders Placed This Encounter   ??? PR IRRIGATION OF BLADDER       Lucindia Lemley N Dimetri Armitage

## 2016-06-16 NOTE — Progress Notes (Signed)
Patient was called and notified of change in appointment

## 2016-06-26 LAB — VITAMIN D, 25 HYDROXY: Vit D 25-Hydroxy: 29.8 ng/mL — AB (ref 32–100)

## 2016-06-26 LAB — PSA, DIAGNOSTIC (PROSTATE SPECIFIC AG): Prostate Specific Ag: 4.93 ng/mL — AB

## 2016-07-02 ENCOUNTER — Encounter: Attending: Urology | Primary: Student in an Organized Health Care Education/Training Program

## 2016-07-07 ENCOUNTER — Ambulatory Visit
Admit: 2016-07-07 | Discharge: 2016-07-07 | Payer: PRIVATE HEALTH INSURANCE | Attending: Urology | Primary: Student in an Organized Health Care Education/Training Program

## 2016-07-07 DIAGNOSIS — N138 Other obstructive and reflux uropathy: Secondary | ICD-10-CM

## 2016-07-07 LAB — AMB POC UROFLOWMETRY
AVG FLOW RATE: 9.6 ml/Seconds
FLOW TIME: 23.3 Seconds
MAX FLOW RATE: 16.7 ml/Seconds
TIME TO MAX, POC: 5.6 Seconds
VOIDED VOLUME: 223 ml
VOIDING TIME: 23.4 Seconds

## 2016-07-07 LAB — AMB POC PVR, MEAS,POST-VOID RES,US,NON-IMAGING: PVR: 6 cc

## 2016-07-07 NOTE — Progress Notes (Signed)
07/07/2016    ASSESSMENT:     Encounter Diagnoses     ICD-10-CM ICD-9-CM   1. BPH with obstruction/lower urinary tract symptoms N40.1 600.01    N13.8 599.69   2. History of urinary retention Z87.898 V13.09     PLAN:   1. BPH  Uroflow showed bell shaped curve with q max 16.7 mL/sec with voided volume of 223 mL; PVR 6ccs.     Reviewed flow with patient- looks great on Flomax    Continue Flomax given appreciable benefit, happy to provide refills as needed    Briefly discussed alternatives including medical therapy, minimally invasive therapies, laser therapy and transurethral resection/coagulative therapies. The risks and benefits of each option were discussed.     2. Elevated PSA  Discussed elevated PSA of 4.9 ng/mL on 06/26/16, previous was 2.5 ng/mL on 10/17/15   Will recheck with PSA reflex to free in 5 weeks and call with results   He spins 3-4 /week and thinks he likely cycled prior to that draw    RTC in 1 year with PVR if PSA wNL; sooner if difficulty  All questions answered.    Thank you for the opportunity to participate in the care of this nice patient.  CC: Dr. Claudie Leach     Body mass index is 33.23 kg/(m^2). Patient's BMI is out of the normal parameters.  Information about BMI was given to the patient.   SUBJECTIVE:     CC:   Chief Complaint   Patient presents with   ??? Benign Prostatic Hypertrophy     Pt reports today for 1 month f/u for BPH and Urinary Retention. Pt passed VT on 06/10/16. Pt states he has been voiding well since. He states he is not getting up to void at night either.         HPI:    Seth Sandoval is a 61 y.o. male  who presents in follow up for urinary retention and BPH.    Continues taking Flomax 0.4mg  with great benefit, his stream is great and he has had no issues since passing his voiding trial on 06/10/16. No longer having nocturia. No complaints today. Denies flank pain, gross hematuria, dysuria, and is asymptomatic for infection today. No f/c/n/v.      PRIOR HISTORY:   He was seen in the ER when he felt he could not urinate and foley was placed without difficulty with return of 1200cc urine. 3 days later the foley was removed and the patient now feels that he is not urinating well. He has sensation of incomplete bladder emptying and dribbling stream. Prior to this episode was having some nocturia and decreased FOS but "not like this". He was started on flomax nightly which has subsequently been doubled, as well as an antibiotic. No fevers or chills. He tolerated the catheter poorly.    Similar episode about a decade ago but self resolved at that time  Has had PSA done with PCP and has been normal per patient. No recent DRE    No flowsheet data found.    Past Medical History:   Diagnosis Date   ??? Back pain    ??? Benign prostatic hyperplasia with incomplete bladder emptying    ??? Herniated disc, cervical    ??? High cholesterol    ??? Hypertension    ??? Pilonidal cyst    ??? Urinary retention      History reviewed. No pertinent surgical history.  Current Outpatient Prescriptions   Medication Sig  Dispense Refill   ??? atorvastatin (LIPITOR) 40 mg tablet take 1 tablet by mouth at bedtime for cholesterol  0   ??? dexamethasone (DECADRON) 4 mg tablet   0   ??? lisinopril (PRINIVIL, ZESTRIL) 40 mg tablet take 1 tablet by mouth daily  0   ??? metFORMIN (GLUCOPHAGE) 1,000 mg tablet   0   ??? minocycline (DYNACIN) 100 mg tablet   0   ??? tamsulosin (FLOMAX) 0.4 mg capsule take 1 capsule by mouth daily AFTER EVENING MEAL FOR URINE FLOW  0     Allergies   Allergen Reactions   ??? Iodine Rash     Social History     Social History   ??? Marital status: DIVORCED     Spouse name: N/A   ??? Number of children: N/A   ??? Years of education: N/A     Occupational History   ??? Not on file.     Social History Main Topics   ??? Smoking status: Never Smoker   ??? Smokeless tobacco: Never Used   ??? Alcohol use Yes   ??? Drug use: No   ??? Sexual activity: No     Other Topics Concern   ??? Not on file     Social History Narrative       Family History   Problem Relation Age of Onset   ??? Family history unknown: Yes     Review of Systems  Constitutional: Fever: No  Skin: Rash: No  HEENT: Hearing difficulty: No  Eyes: Blurred vision: No  Cardiovascular: Chest pain: No  Respiratory: Shortness of breath: No  Gastrointestinal: Nausea/vomiting: No  Musculoskeletal: Back pain: No  Neurological: Weakness: No  Psychological: Memory loss: No  Comments/additional findings:     OBJECTIVE:   Physical Exam:   Visit Vitals   ??? BP 140/90   ??? Ht 6' (1.829 m)   ??? Wt 245 lb (111.1 kg)   ??? BMI 33.23 kg/m2     General: WNWD, well-appearing male in NAD  DRE 06/03/16- smooth and symmetric without nodules or tenderness, enlarged 2+    Review of Labs, Medical Images, tracings or specimens:    Results for orders placed or performed in visit on 07/07/16   AMB POC UROFLOWMETRY   Result Value Ref Range    VOIDING TIME 23.4 Seconds    FLOW TIME 23.3 Seconds    TIME TO MAX 5.6 Seconds    MAX FLOW RATE 16.7 ml/Seconds    AVG FLOW RATE 9.6 ml/Seconds    VOIDED VOLUME 223 ml   AMB POC PVR, MEAS,POST-VOID RES,US,NON-IMAGING   Result Value Ref Range    PVR 6 cc           Seth MillardJessica Veldon Wager, MD  [facility]  A [facility]     Medical documentation provided with the assistance of Raliegh ScarletDanielle Rossi, medical scribe for Gertie FeyJessica M Rafaelita Foister, MD.

## 2016-08-20 ENCOUNTER — Institutional Professional Consult (permissible substitution)
Admit: 2016-08-20 | Discharge: 2016-08-20 | Payer: PRIVATE HEALTH INSURANCE | Primary: Student in an Organized Health Care Education/Training Program

## 2016-08-20 DIAGNOSIS — R339 Retention of urine, unspecified: Secondary | ICD-10-CM

## 2016-08-20 LAB — PROSTATE SPECIFIC ANTIGEN, TOTAL (PSA): Prostate Specific Ag: 1.97 ng/mL (ref 0.00–4.00)

## 2016-08-20 NOTE — Progress Notes (Signed)
Seth Sandoval presents today for lab draw per Dr. Allena Earing order.    Patient will be notified with lab results.    PSA obtained via venipuncture without any difficulty.      Orders Placed This Encounter   ??? PROSTATE SPECIFIC ANTIGEN, TOTAL (PSA)   ??? COLLECTION VENOUS BLOOD,VENIPUNCTURE       Norton Pastel

## 2016-08-21 NOTE — Progress Notes (Signed)
Please let him know his PSA is back down to normal range at 1.97. OK to keep  His scheduled follow up with me next year, no indication for biopsy  Thanks    JD

## 2016-08-27 NOTE — Telephone Encounter (Signed)
Pt called back and message fr Dr Allena Earing was relayed to the patient.

## 2016-08-27 NOTE — Telephone Encounter (Addendum)
Attempted to contact pt, left message regarding results.  Seth Sandoval    ----- Message from Gertie Fey, MD sent at 08/21/2016  1:58 PM EDT -----  Please let him know his PSA is back down to normal range at 1.97. OK to keep  His scheduled follow up with me next year, no indication for biopsy  Thanks    JD

## 2016-08-31 NOTE — Telephone Encounter (Addendum)
Patient was called and notified of his PSA results. He was appreciative. He will keep follow up as scheduled. ----- Message from Gertie Fey, MD sent at 08/21/2016  1:58 PM EDT -----  Please let him know his PSA is back down to normal range at 1.97. OK to keep  His scheduled follow up with me next year, no indication for biopsy  Thanks    JD

## 2017-07-07 LAB — VITAMIN D, 25 HYDROXY: Vit D 25-Hydroxy: 27 ng/mL — AB (ref 32.0–100.0)

## 2017-07-07 LAB — PSA, DIAGNOSTIC (PROSTATE SPECIFIC AG): Prostate Specific Ag: 4.62 ng/mL — AB (ref ?–4.100)

## 2017-07-13 ENCOUNTER — Ambulatory Visit
Admit: 2017-07-13 | Discharge: 2017-07-13 | Attending: Urology | Primary: Student in an Organized Health Care Education/Training Program

## 2017-07-13 DIAGNOSIS — N401 Enlarged prostate with lower urinary tract symptoms: Secondary | ICD-10-CM

## 2017-07-13 LAB — AMB POC UROFLOWMETRY
AVG FLOW RATE: 3.2 ml/Seconds
FLOW TIME: 26.4 Seconds
MAX FLOW RATE: 7.2 ml/Seconds
TIME TO MAX, POC: 21.1 Seconds
VOIDED VOLUME: 85 ml
VOIDING TIME: 26.5 Seconds

## 2017-07-13 LAB — AMB POC PVR, MEAS,POST-VOID RES,US,NON-IMAGING: PVR: 10 cc

## 2017-07-13 NOTE — Progress Notes (Signed)
07/13/2017      ASSESSMENT:     Encounter Diagnoses     ICD-10-CM ICD-9-CM   1. Benign prostatic hyperplasia with lower urinary tract symptoms, symptom details unspecified N40.1 600.01   2. Prostate cancer screening Z12.5 V76.44     PLAN:   1. BPH  Uroflow too low to interpret; PVR 10 ccs.    Continue Flomax 0.4 mg. Happy to provide refills as needed.      2. Elevated PSA  Last PSA was 4.62 ng/mL on 07/07/17, PSA prior was 4.9 ng/mL on 06/26/16.   Will recheck with PSA reflex to free in 5 weeks and call with results.   He spins 5-7x/week and thinks he likely cycled prior to that draw. Patient was advised to refrain from cycling and ejaculating prior to PSA draw.    If PSA is WNL, will follow up in 1 year.     DRE today - smooth, no nodules.     RTC in 1 year with PVR if PSA WNL; sooner if difficulty  All questions answered.    Thank you for the opportunity to participate in the care of this nice patient.  CC: Dr. Claudie Leach     Body mass index is 33.63 kg/m??. Patient's BMI is out of the normal parameters.  Information about BMI was given to the patient.   SUBJECTIVE:     CC:   Chief Complaint   Patient presents with   ??? Benign Prostatic Hypertrophy     Pt states he is urinating well. He is having no difficulty. he is not waking up to urinate. He is going about every 3 hours depending on how much he drinks. He has a good stream. Pt states he does realize when he gets the urge he does make his way to the bathroom he doesnt want to hold it      HPI:  Seth Sandoval is a 62 y.o. male  who presents in follow up for urinary retention and BPH.    Presents today to review PSA lab values. He admits to cycling 7-9 days prior to PSA draw. He is voiding well, no difficulties. Nocturia x 0. He reports daytime frequency q 3 hours depending on fluid intake. Intermittent urgency. Denies flank pain, gross hematuria, dysuria, asymptomatic for infection. No f/c/n/v.      Brother has h/o prostate issues - patient unsure if prostate cancer.     PRIOR HISTORY:  He was seen in the ER when he felt he could not urinate and foley was placed without difficulty with return of 1200cc urine. 3 days later the foley was removed and the patient now feels that he is not urinating well. He has sensation of incomplete bladder emptying and dribbling stream. Prior to this episode was having some nocturia and decreased FOS but "not like this". He was started on flomax nightly which has subsequently been doubled, as well as an antibiotic. No fevers or chills. He tolerated the catheter poorly.    Similar episode about a decade ago but self resolved at that time  Has had PSA done with PCP and has been normal per patient. No recent DRE    Continues taking Flomax 0.4mg  with great benefit, his stream is great and he has had no issues since passing his voiding trial on 06/10/16. No longer having nocturia. No complaints today.     AUA Symptom Score 07/13/2017   Over the past month how often have you had the sensation that your  bladder was not completely empty after you finished urinating? 0   Over the past month, how often have had to urinate again less than 2 hours after you last finished urinating? 0   Over the past month, how often have you found you stopped and started again several times when you urinated? 0   Over the past month, how often have you found it difficult to postpone urination? 1   Over the past month, how often have you had a weak urinary stream? 0   Over the past month, how often have you had to push or strain to begin urinating? 0   Over the past month, how many times did you most typically get up to urinate from the time you went to bed at night until the time you got up in the morning? 2   AUA Score 3   If you were to spend the rest of your life with your urinary condition the way it is now, how would you feel about that? Delighted       Past Medical History:   Diagnosis Date    ??? Back pain    ??? Benign prostatic hyperplasia with incomplete bladder emptying    ??? BPH with obstruction/lower urinary tract symptoms    ??? Herniated disc, cervical    ??? High cholesterol    ??? History of urinary retention    ??? Hypertension    ??? Pilonidal cyst    ??? Urinary retention      History reviewed. No pertinent surgical history.  Current Outpatient Medications   Medication Sig Dispense Refill   ??? simvastatin (ZOCOR) 40 mg tablet 40 mg.     ??? gabapentin (NEURONTIN) 300 mg capsule TAKE 1 CAPSULE BY MOUTH EVERY 8 HOURS (THREE TIMES A DAY) AS NEEDED FOR PAIN  0   ??? furosemide (LASIX) 40 mg tablet      ??? ibuprofen (MOTRIN) 200 mg tablet 200 mg.     ??? azithromycin (ZITHROMAX) 200 mg/5 mL suspension Take  by mouth daily.     ??? atorvastatin (LIPITOR) 40 mg tablet take 1 tablet by mouth at bedtime for cholesterol  0   ??? dexamethasone (DECADRON) 4 mg tablet   0   ??? lisinopril (PRINIVIL, ZESTRIL) 40 mg tablet take 1 tablet by mouth daily  0   ??? metFORMIN (GLUCOPHAGE) 1,000 mg tablet   0   ??? minocycline (DYNACIN) 100 mg tablet   0   ??? tamsulosin (FLOMAX) 0.4 mg capsule take 1 capsule by mouth daily AFTER EVENING MEAL FOR URINE FLOW  0     Allergies   Allergen Reactions   ??? Iodine Rash   ??? Shellfish Derived Swelling     Shrimp     Social History     Socioeconomic History   ??? Marital status: DIVORCED     Spouse name: Not on file   ??? Number of children: Not on file   ??? Years of education: Not on file   ??? Highest education level: Not on file   Social Needs   ??? Financial resource strain: Not on file   ??? Food insecurity - worry: Not on file   ??? Food insecurity - inability: Not on file   ??? Transportation needs - medical: Not on file   ??? Transportation needs - non-medical: Not on file   Occupational History   ??? Not on file   Tobacco Use   ??? Smoking status: Never Smoker   ??? Smokeless  tobacco: Never Used   Substance and Sexual Activity   ??? Alcohol use: Yes   ??? Drug use: No   ??? Sexual activity: No   Other Topics Concern    ??? Not on file   Social History Narrative   ??? Not on file      Family History   Family history unknown: Yes     Review of Systems  Constitutional: Fever: No  Skin: Rash: No  HEENT: Hearing difficulty: No  Eyes: Blurred vision: No  Cardiovascular: Chest pain: No  Respiratory: Shortness of breath: No  Gastrointestinal: Nausea/vomiting: No  Musculoskeletal: Back pain: No  Neurological: Weakness: No  Psychological: Memory loss: No  Comments/additional findings:     OBJECTIVE:   Physical Exam:   Visit Vitals  BP 132/86   Ht 6' (1.829 m)   Wt 248 lb (112.5 kg)   BMI 33.63 kg/m??     General: WNWD, well-appearing male in NAD  DRE 06/03/16- smooth and symmetric without nodules or tenderness, enlarged 2+  DRE 07/13/17: smooth and symmetric without nodules or tenderness.     Review of Labs, Medical Images, tracings or specimens:    Results for orders placed or performed in visit on 07/13/17   AMB POC UROFLOWMETRY   Result Value Ref Range    VOIDING TIME 26.5 Seconds    FLOW TIME 26.4 Seconds    TIME TO MAX 21.1 Seconds    MAX FLOW RATE 7.2 ml/Seconds    AVG FLOW RATE 3.2 ml/Seconds    VOIDED VOLUME 85 ml   AMB POC PVR, MEAS,POST-VOID RES,US,NON-IMAGING   Result Value Ref Range    PVR 10 cc     Theressa Millard, MD  [facility]  A Division of Urology of [facility] documentation is provided with the assistance of Philis Fendt, Medical Scribe for Gertie Fey, MD.    Any elements of the PMH, FH, SHx, ROS, or preliminary elements of the HPI that were entered by a medical assistant have been reviewed in full.

## 2017-08-13 ENCOUNTER — Institutional Professional Consult (permissible substitution): Admit: 2017-08-13 | Discharge: 2017-08-13 | Primary: Student in an Organized Health Care Education/Training Program

## 2017-08-13 DIAGNOSIS — Z125 Encounter for screening for malignant neoplasm of prostate: Secondary | ICD-10-CM

## 2017-08-13 LAB — PSA (TOTAL, REFLEX TO FREE): Prostate Specific Ag: 2.65 ng/mL (ref 0.00–4.00)

## 2017-08-13 NOTE — Progress Notes (Signed)
Please let him know PSA 2.65 which is relatively stable from previous  OK to recheck 1 year and office visit with me; January 2020. Check PSA prior to appt and please remind him no spinning 2 days prior to check.   thanks

## 2017-08-13 NOTE — Progress Notes (Signed)
Edson Snowballobert J Digangi presents today for lab draw per Dr. Allena EaringeLong order.   Dr. Janalyn Rouseawls was present in the clinic as incident to.     PSA obtained via venipuncture without any difficulty.    Patient will be notified with lab results.       Orders Placed This Encounter   ??? PSA (TOTAL, REFLEX TO FREE)   ??? COLLECTION VENOUS BLOOD,VENIPUNCTURE       Tomma RakersPatricia K Azhar Yogi, LPN

## 2017-08-16 LAB — PSA (FREE)
PSA, % Free: 28 %
PSA, Free: 0.752 ng/mL

## 2017-08-25 NOTE — Telephone Encounter (Signed)
-----   Message from Gertie FeyJessica M Delong, MD sent at 08/25/2017  7:54 AM EDT -----  Please let him know PSA 2.65 which is relatively stable from previous  OK to recheck 1 year and office visit with me; January 2020. Check PSA prior to appt and please remind him no spinning 2 days prior to check.   thanks

## 2017-08-25 NOTE — Telephone Encounter (Signed)
LVM to contact office for test results, please advise pt of results when call is returned.

## 2017-08-25 NOTE — Telephone Encounter (Signed)
Patient was called and made aware of PSA results. His f/u appointments set for January

## 2018-06-03 ENCOUNTER — Institutional Professional Consult (permissible substitution)
Admit: 2018-06-03 | Payer: PRIVATE HEALTH INSURANCE | Primary: Student in an Organized Health Care Education/Training Program

## 2018-06-03 ENCOUNTER — Encounter: Primary: Student in an Organized Health Care Education/Training Program

## 2018-06-03 DIAGNOSIS — N401 Enlarged prostate with lower urinary tract symptoms: Secondary | ICD-10-CM

## 2018-06-03 LAB — PROSTATE SPECIFIC ANTIGEN, TOTAL (PSA)
PSA: 2.8 ng/mL (ref 0.00–4.00)
Prostate Specific Ag: 2.8 ng/mL (ref 0.00–4.00)

## 2018-06-03 NOTE — Progress Notes (Signed)
Seth Sandoval presents today for lab draw per Dr. Delong's order.   Dr. Brugh was present in the clinic as incident to.     PSA obtained via venipuncture without any difficulty.    Patient will be notified with lab results.       Orders Placed This Encounter   ??? PROSTATE SPECIFIC ANTIGEN, TOTAL (PSA)   ??? COLLECTION VENOUS BLOOD,VENIPUNCTURE       [name] A [name]

## 2018-06-03 NOTE — Progress Notes (Signed)
Please send to patient. Stable overall, slight increase  Will discuss at appt next week

## 2018-06-03 NOTE — Progress Notes (Signed)
 Seth Sandoval presents today for lab draw per Dr. Reeda order.   Dr. Arsenio was present in the clinic as incident to.     PSA obtained via venipuncture without any difficulty.    Patient will be notified with lab results.       Orders Placed This Encounter   . PROSTATE SPECIFIC ANTIGEN, TOTAL (PSA)   . COLLECTION VENOUS BLOOD,VENIPUNCTURE       [name]

## 2018-06-03 NOTE — Progress Notes (Signed)
Please send to patient. Stable overall, slight increase  Will discuss at appt next week

## 2018-06-06 NOTE — Telephone Encounter (Addendum)
Patient was called and made aware of his PSA and we will discuss further at his follow up this week.   ----- Message from Gertie Fey, MD sent at 06/03/2018  4:31 PM EST -----  Please send to patient. Stable overall, slight increase  Will discuss at appt next week

## 2018-06-09 ENCOUNTER — Ambulatory Visit: Attending: Urology

## 2018-06-09 ENCOUNTER — Ambulatory Visit
Admit: 2018-06-09 | Discharge: 2018-06-09 | Payer: PRIVATE HEALTH INSURANCE | Attending: Urology | Primary: Student in an Organized Health Care Education/Training Program

## 2018-06-09 ENCOUNTER — Encounter: Attending: Urology | Primary: Student in an Organized Health Care Education/Training Program

## 2018-06-09 DIAGNOSIS — Z125 Encounter for screening for malignant neoplasm of prostate: Secondary | ICD-10-CM

## 2018-06-09 LAB — AMB POC UROFLOWMETRY
AVG FLOW RATE POC: 5.8 ml/Seconds
AVG FLOW RATE: 5.8 ml/Seconds
FLOW TIME POC: 23.6 Seconds
FLOW TIME: 23.6 Seconds
MAX FLOW RATE POC: 10.2 ml/Seconds
MAX FLOW RATE: 10.2 ml/Seconds
TIME TO MAX, POC: 7.6 Seconds
TIME TO MAX, POC: 7.6 Seconds
VOIDED VOLUME POC: 138 ml
VOIDED VOLUME: 138 ml
VOIDING TIME POC: 31.7 Seconds
VOIDING TIME: 31.7 Seconds

## 2018-06-09 LAB — AMB POC URINALYSIS DIP STICK AUTO W/O MICRO
Bilirubin (UA POC): NEGATIVE
Bilirubin, Urine, POC: NEGATIVE
Blood (UA POC): NEGATIVE
Blood (UA POC): NEGATIVE
Glucose (UA POC): NEGATIVE
Glucose, Urine, POC: NEGATIVE
Ketones (UA POC): NEGATIVE
Ketones, Urine, POC: NEGATIVE
Leukocyte Esterase, Urine, POC: NEGATIVE
Leukocyte esterase (UA POC): NEGATIVE
Nitrite, Urine, POC: NEGATIVE
Nitrites (UA POC): NEGATIVE
Protein (UA POC): NEGATIVE
Protein, Urine, POC: NEGATIVE
Specific Gravity, Urine, POC: 1.025 NA (ref 1.001–1.035)
Specific gravity (UA POC): 1.025 (ref 1.001–1.035)
Urobilinogen (UA POC): 0.2 (ref 0.2–1)
Urobilinogen, POC: 0.2 (ref 0.2–1)
pH (UA POC): 5 (ref 4.6–8.0)
pH, Urine, POC: 5 NA (ref 4.6–8.0)

## 2018-06-09 LAB — AMB POC PVR, MEAS,POST-VOID RES,US,NON-IMAGING
PVR POC: 37 cc
PVR: 37 cc

## 2018-06-09 NOTE — Progress Notes (Signed)
06/09/2018      ASSESSMENT:     Encounter Diagnoses     ICD-10-CM ICD-9-CM   1. Prostate cancer screening Z12.5 V76.44   2. Benign prostatic hyperplasia with lower urinary tract symptoms, symptom details unspecified N40.1 600.01     PLAN:   1. BPH- Continue Flomax 0.4 mg. Happy to provide refills as needed.      2. Elevated PSA  Given family history, we discussed the AUA guidelines and general PSA trends associated with age. We reviewed the implications of an elevated PSA and the uncertainty surrounding it. In general, a man's PSA increases with age and is produced by both normal and cancerous prostate tissue. Although the PSA test can detect high levels of PSA in the blood, the test doesn't provide precise diagnostic information about the condition of the prostate.    Last PSA was 2.80 ng/ml on 06/03/18. Reviewed trend with patient.     DRE today smooth, no nodules, mildly enlarged.     RTC in 1 year; sooner if difficulty  All questions answered.    Thank you for the opportunity to participate in the care of this nice patient.  CC: Dr. Claudie Leach     Body mass index is 33.63 kg/m??. Patient's BMI is out of the normal parameters.  Information about BMI was given to the patient.   SUBJECTIVE:     CC:   Chief Complaint   Patient presents with   ??? Benign Prostatic Hypertrophy     Pt in for 1 yr f/u. Pt is currently on flomax. States he rarely gets up at night. Stream is good and strong. Pt's PSA was WNL.      HPI:  Seth Sandoval is a 63 y.o. male  who presents in follow up for urinary retention and BPH.    He has been doing well over the past year. He continues on Flomax 0.4 mg with continued benefit. Rare nocturia. Good stream and FOS. Denies flank pain, gross hematuria, dysuria, asymptomatic for infection. No f/c/n/v.      Brother has h/o prostate cancer, recently underwent DVP. His PSA was around 4.5 at diagnosis.     PSA   PSA   Latest Ref Rng & Units 4.100 ng/mL   06/03/2018 2.80   08/13/2017 2.65    07/07/2017 4.620 (A)   08/20/2016 1.97   06/26/2016 4.930 (A)   10/15/2015 2.5     *Patient had attended spin class prior to 07/07/17 value.     PRIOR HISTORY:  He was seen in the ER when he felt he could not urinate and foley was placed without difficulty with return of 1200cc urine. 3 days later the foley was removed and the patient now feels that he is not urinating well. He has sensation of incomplete bladder emptying and dribbling stream. Prior to this episode was having some nocturia and decreased FOS but "not like this". He was started on flomax nightly which has subsequently been doubled, as well as an antibiotic. No fevers or chills. He tolerated the catheter poorly.    Presents today to review PSA lab values. He admits to cycling 7-9 days prior to PSA draw. He is voiding well, no difficulties. Nocturia x 0. He reports daytime frequency q 3 hours depending on fluid intake. Intermittent urgency. Continues on Flomax 0.4 mg with benefit. Denies flank pain, gross hematuria, dysuria, asymptomatic for infection. No f/c/n/v.       AUA Symptom Score 06/09/2018   Over the past  month how often have you had the sensation that your bladder was not completely empty after you finished urinating? 0   Over the past month, how often have had to urinate again less than 2 hours after you last finished urinating? 0   Over the past month, how often have you found you stopped and started again several times when you urinated? 0   Over the past month, how often have you found it difficult to postpone urination? 0   Over the past month, how often have you had a weak urinary stream? 0   Over the past month, how often have you had to push or strain to begin urinating? 0   Over the past month, how many times did you most typically get up to urinate from the time you went to bed at night until the time you got up in the morning? 1   AUA Score 1    If you were to spend the rest of your life with your urinary condition the way it is now, how would you feel about that? Delighted       Past Medical History:   Diagnosis Date   ??? Back pain    ??? Benign prostatic hyperplasia with incomplete bladder emptying    ??? Benign prostatic hyperplasia with lower urinary tract symptoms     symptom details unspecified   ??? BPH with obstruction/lower urinary tract symptoms    ??? Herniated disc, cervical    ??? High cholesterol    ??? History of urinary retention    ??? Hypertension    ??? Pilonidal cyst    ??? Prostate cancer screening    ??? Urinary retention      History reviewed. No pertinent surgical history.  Current Outpatient Medications   Medication Sig Dispense Refill   ??? clopidogreL (PLAVIX) 75 mg tab Take  by mouth.     ??? simvastatin (ZOCOR) 40 mg tablet 40 mg.     ??? furosemide (LASIX) 40 mg tablet      ??? ibuprofen (MOTRIN) 200 mg tablet 200 mg.     ??? atorvastatin (LIPITOR) 40 mg tablet take 1 tablet by mouth at bedtime for cholesterol  0   ??? dexamethasone (DECADRON) 4 mg tablet   0   ??? lisinopril (PRINIVIL, ZESTRIL) 40 mg tablet take 1 tablet by mouth daily  0   ??? metFORMIN (GLUCOPHAGE) 1,000 mg tablet   0   ??? tamsulosin (FLOMAX) 0.4 mg capsule take 1 capsule by mouth daily AFTER EVENING MEAL FOR URINE FLOW  0     Allergies   Allergen Reactions   ??? Iodine Rash   ??? Shellfish Derived Swelling     Shrimp     Social History     Socioeconomic History   ??? Marital status: DIVORCED     Spouse name: Not on file   ??? Number of children: Not on file   ??? Years of education: Not on file   ??? Highest education level: Not on file   Occupational History   ??? Not on file   Social Needs   ??? Financial resource strain: Not on file   ??? Food insecurity:     Worry: Not on file     Inability: Not on file   ??? Transportation needs:     Medical: Not on file     Non-medical: Not on file   Tobacco Use   ??? Smoking status: Never Smoker   ??? Smokeless tobacco: Never  Used   Substance and Sexual Activity    ??? Alcohol use: Yes   ??? Drug use: No   ??? Sexual activity: Never   Lifestyle   ??? Physical activity:     Days per week: Not on file     Minutes per session: Not on file   ??? Stress: Not on file   Relationships   ??? Social connections:     Talks on phone: Not on file     Gets together: Not on file     Attends religious service: Not on file     Active member of club or organization: Not on file     Attends meetings of clubs or organizations: Not on file     Relationship status: Not on file   ??? Intimate partner violence:     Fear of current or ex partner: Not on file     Emotionally abused: Not on file     Physically abused: Not on file     Forced sexual activity: Not on file   Other Topics Concern   ??? Not on file   Social History Narrative   ??? Not on file      Family History   Family history unknown: Yes     Review of Systems  Constitutional: Fever: No  Skin: Rash: No  HEENT: Hearing difficulty: No  Eyes: Blurred vision: No  Cardiovascular: Chest pain: No  Respiratory: Shortness of breath: No  Gastrointestinal: Nausea/vomiting: No  Musculoskeletal: Back pain: No  Neurological: Weakness: No  Psychological: Memory loss: No  Comments/additional findings:     OBJECTIVE:   Physical Exam:   Visit Vitals  BP 140/88   Ht 6' (1.829 m)   Wt 248 lb (112.5 kg)   BMI 33.63 kg/m??     General: WNWD, well-appearing male in NAD  DRE 06/03/16- smooth and symmetric without nodules or tenderness, enlarged 2+  DRE 07/13/17: smooth and symmetric without nodules or tenderness  DRE 06/09/18: smooth and symmetric without nodules or tenderness, mildly enlarged.         Review of Labs, Medical Images, tracings or specimens:    Results for orders placed or performed in visit on 06/09/18   AMB POC UROFLOWMETRY   Result Value Ref Range    VOIDING TIME 31.7 Seconds    FLOW TIME 23.6 Seconds    TIME TO MAX 7.6 Seconds    MAX FLOW RATE 10.2 ml/Seconds    AVG FLOW RATE 5.8 ml/Seconds    VOIDED VOLUME 138 ml   AMB POC URINALYSIS DIP STICK AUTO W/O MICRO    Result Value Ref Range    Color (UA POC) Yellow     Clarity (UA POC) Clear     Glucose (UA POC) Negative Negative    Bilirubin (UA POC) Negative Negative    Ketones (UA POC) Negative Negative    Specific gravity (UA POC) 1.025 1.001 - 1.035    Blood (UA POC) Negative Negative    pH (UA POC) 5.0 4.6 - 8.0    Protein (UA POC) Negative Negative    Urobilinogen (UA POC) 0.2 mg/dL 0.2 - 1    Nitrites (UA POC) Negative Negative    Leukocyte esterase (UA POC) Negative Negative   AMB POC PVR, MEAS,POST-VOID RES,US,NON-IMAGING   Result Value Ref Range    PVR 37 cc     Theressa Millard, MD  [facility] Pel[facility]  A Division of Urology of [place]  Medical documentation is provided with the assistance of Cherlyn Cushing, Medical Scribe for Teodora Medici, MD.    Any elements of the PMH, FH, SHx, ROS, or preliminary elements of the HPI that were entered by a medical assistant have been reviewed in full.

## 2018-06-09 NOTE — Telephone Encounter (Signed)
Patient called to request

## 2018-06-09 NOTE — Progress Notes (Signed)
Progress Notes by Gertie Feyelong, Kamayah Pillay M, MD at 06/09/18 0900                Author: Gertie Feyelong, Reyah Streeter M, MD  Service: --  Author Type: Physician       Filed: 06/11/18 0915  Encounter Date: 06/09/2018  Status: Signed          Editor: Gertie Feyelong, Azari Hasler M, MD (Physician)               06/09/2018             ASSESSMENT:          Encounter Diagnoses              ICD-10-CM  ICD-9-CM          1.  Prostate cancer screening  Z12.5  V76.44          2.  Benign prostatic hyperplasia with lower urinary tract symptoms, symptom details unspecified  N40.1  600.01          PLAN:     1. BPH- Continue Flomax 0.4 mg. Happy to provide refills as needed.        2. Elevated PSA   Given family history, we discussed the AUA guidelines and general PSA trends associated with age. We reviewed the implications of an elevated PSA and the uncertainty surrounding it. In general, a man's PSA increases with age and is produced by both normal  and cancerous prostate tissue. Although the PSA test can detect high levels of PSA in the blood, the test doesn't provide precise diagnostic information about the condition of the prostate.     Last PSA was 2.80 ng/ml on 06/03/18. Reviewed trend with patient.      DRE today smooth, no nodules, mildly enlarged.       RTC in 1 year; sooner if difficulty   All questions answered.        Thank you for the opportunity to participate in the care of this nice patient.   CC: Dr. Claudie LeachMollenkopf       Body mass index is 33.63 kg/m??. Patient's BMI is out of the normal parameters.  Information about BMI was given to the patient.    SUBJECTIVE:           CC:    Chief Complaint      Patient presents with      ?  Benign Prostatic Hypertrophy          Pt in for 1 yr f/u. Pt is currently on flomax. States he rarely gets up at night. Stream is good and strong. Pt's PSA was WNL.            HPI:   Seth SnowballRobert J Sandoval is a 63 y.o.  male  who presents in follow up for urinary retention and BPH.      He has been doing well over the past year. He  continues on Flomax 0.4 mg with continued benefit. Rare nocturia. Good stream and FOS. Denies flank pain, gross hematuria, dysuria, asymptomatic for infection. No f/c/n/v.        Brother has h/o prostate cancer, recently underwent DVP. His PSA was around 4.5 at diagnosis.       PSA    PSA      Latest Ref Rng & Units  4.100 ng/mL      06/03/2018  2.80      08/13/2017  2.65      07/07/2017  4.620 (A)  08/20/2016  1.97      06/26/2016  4.930 (A)      10/15/2015  2.5           *Patient had attended spin class prior to 07/07/17 value.       PRIOR HISTORY:   He was seen in the ER when he felt he could not urinate and foley was placed without difficulty with return of 1200cc urine. 3 days later the foley was removed and the patient now feels that he is not urinating well. He has sensation of incomplete bladder  emptying and dribbling stream. Prior to this episode was having some nocturia and decreased FOS but "not like this". He was started on flomax nightly which has subsequently been doubled, as well as an antibiotic. No fevers or chills. He tolerated the  catheter poorly.      Presents today to review PSA lab values. He admits to cycling 7-9 days prior to PSA draw. He is voiding well, no difficulties. Nocturia x 0. He reports daytime frequency q 3 hours depending on fluid intake. Intermittent urgency. Continues on Flomax 0.4  mg with benefit. Denies flank pain, gross hematuria, dysuria, asymptomatic for infection. No f/c/n/v.          AUA Symptom Score  06/09/2018      Over the past month how often have you had the sensation that your bladder was not completely empty after you finished urinating?  0      Over the past month, how often have had to urinate again less than 2 hours after you last finished urinating?  0      Over the past month, how often have you found you stopped and started again several times when you urinated?  0      Over the past month, how often have you found it difficult to postpone urination?  0       Over the past month, how often have you had a weak urinary stream?  0      Over the past month, how often have you had to push or strain to begin urinating?  0      Over the past month, how many times did you most typically get up to urinate from the time you went to bed at night until the time you got  up in the morning?  1      AUA Score  1      If you were to spend the rest of your life with your urinary condition the way it is now, how would you feel about that?  Delighted              Past Medical History:      Diagnosis  Date      ?  Back pain        ?  Benign prostatic hyperplasia with incomplete bladder emptying        ?  Benign prostatic hyperplasia with lower urinary tract symptoms          symptom details unspecified      ?  BPH with obstruction/lower urinary tract symptoms        ?  Herniated disc, cervical        ?  High cholesterol        ?  History of urinary retention        ?  Hypertension        ?  Pilonidal cyst        ?  Prostate cancer screening        ?  Urinary retention             History reviewed. No pertinent surgical history.   Current Outpatient Medications      Medication  Sig  Dispense  Refill      ?  clopidogreL (PLAVIX) 75 mg tab  Take  by mouth.          ?  simvastatin (ZOCOR) 40 mg tablet  40 mg.          ?  furosemide (LASIX) 40 mg tablet            ?  ibuprofen (MOTRIN) 200 mg tablet  200 mg.          ?  atorvastatin (LIPITOR) 40 mg tablet  take 1 tablet by mouth at bedtime for cholesterol    0      ?  dexamethasone (DECADRON) 4 mg tablet      0      ?  lisinopril (PRINIVIL, ZESTRIL) 40 mg tablet  take 1 tablet by mouth daily    0      ?  metFORMIN (GLUCOPHAGE) 1,000 mg tablet      0      ?  tamsulosin (FLOMAX) 0.4 mg capsule  take 1 capsule by mouth daily AFTER EVENING MEAL FOR URINE FLOW    0           Allergies      Allergen  Reactions      ?  Iodine  Rash      ?  Shellfish Derived  Swelling          Shrimp           Social History           Socioeconomic History      ?   Marital status:  DIVORCED          Spouse name:  Not on file      ?  Number of children:  Not on file      ?  Years of education:  Not on file      ?  Highest education level:  Not on file      Occupational History      ?  Not on file      Social Needs      ?  Financial resource strain:  Not on file      ?  Food insecurity:          Worry:  Not on file          Inability:  Not on file      ?  Transportation needs:          Medical:  Not on file          Non-medical:  Not on file      Tobacco Use      ?  Smoking status:  Never Smoker      ?  Smokeless tobacco:  Never Used      Substance and Sexual Activity      ?  Alcohol use:  Yes      ?  Drug use:  No      ?  Sexual activity:  Never      Lifestyle      ?  Physical activity:          Days per week:  Not on file  Minutes per session:  Not on file      ?  Stress:  Not on file      Relationships      ?  Social connections:          Talks on phone:  Not on file          Gets together:  Not on file          Attends religious service:  Not on file          Active member of club or organization:  Not on file          Attends meetings of clubs or organizations:  Not on file          Relationship status:  Not on file      ?  Intimate partner violence:          Fear of current or ex partner:  Not on file          Emotionally abused:  Not on file          Physically abused:  Not on file          Forced sexual activity:  Not on file      Other Topics  Concern      ?  Not on file      Social History Narrative      ?  Not on file            Family History      Family history unknown: Yes           Review of Systems   Constitutional: Fever: No   Skin: Rash: No   HEENT: Hearing difficulty: No   Eyes: Blurred vision: No   Cardiovascular: Chest pain: No   Respiratory: Shortness of breath: No   Gastrointestinal: Nausea/vomiting: No   Musculoskeletal: Back pain: No   Neurological: Weakness: No   Psychological: Memory loss: No   Comments/additional findings:       OBJECTIVE:         Physical Exam:    Visit Vitals   BP  140/88      Ht  6' (1.829 m)      Wt  248 lb (112.5 kg)      BMI  33.63 kg/m??           General: WNWD, well-appearing male in NAD   DRE 06/03/16- smooth and symmetric without nodules or tenderness, enlarged 2+   DRE 07/13/17: smooth and symmetric without nodules or tenderness   DRE 06/09/18: smooth and symmetric without nodules or tenderness, mildly enlarged.            Review of Labs,  Medical Images, tracings or specimens:      Results for orders placed or performed in visit on 06/09/18      AMB POC UROFLOWMETRY      Result  Value  Ref Range        VOIDING TIME  31.7  Seconds        FLOW TIME  23.6  Seconds        TIME TO MAX  7.6  Seconds        MAX FLOW RATE  10.2  ml/Seconds        AVG FLOW RATE  5.8  ml/Seconds        VOIDED VOLUME  138  ml      AMB POC URINALYSIS DIP STICK AUTO W/O MICRO  Result  Value  Ref Range        Color (UA POC)  Yellow          Clarity (UA POC)  Clear          Glucose (UA POC)  Negative  Negative        Bilirubin (UA POC)  Negative  Negative        Ketones (UA POC)  Negative  Negative        Specific gravity (UA POC)  1.025  1.001 - 1.035        Blood (UA POC)  Negative  Negative        pH (UA POC)  5.0  4.6 - 8.0        Protein (UA POC)  Negative  Negative        Urobilinogen (UA POC)  0.2 mg/dL  0.2 - 1        Nitrites (UA POC)  Negative  Negative        Leukocyte esterase (UA POC)  Negative  Negative      AMB POC PVR, MEAS,POST-VOID RES,US,NON-IMAGING      Result  Value  Ref Range        PVR  37  cc           Theressa Millard, MD   [facility]   A Division of Urology of [facility] documentation is provided with the assistance of Philis Fendt, Medical Scribe for Gertie Fey, MD.      Any elements of the PMH, FH, SHx, ROS, or preliminary elements of the HPI that were entered by a medical assistant have been reviewed in full.

## 2018-11-09 LAB — HEMOGLOBIN A1C
Estimated Avg Glucose, External: 128 mg/dL — ABNORMAL HIGH (ref 91–123)
Hemoglobin A1C, External: 6.1 % — ABNORMAL HIGH (ref 4.8–5.6)

## 2019-05-26 ENCOUNTER — Encounter: Payer: PRIVATE HEALTH INSURANCE | Primary: Student in an Organized Health Care Education/Training Program

## 2019-05-29 ENCOUNTER — Encounter: Payer: PRIVATE HEALTH INSURANCE | Primary: Student in an Organized Health Care Education/Training Program

## 2019-05-29 ENCOUNTER — Encounter: Primary: Student in an Organized Health Care Education/Training Program

## 2019-06-01 ENCOUNTER — Encounter: Attending: Urology | Primary: Student in an Organized Health Care Education/Training Program

## 2019-06-01 ENCOUNTER — Encounter
Payer: PRIVATE HEALTH INSURANCE | Attending: Urology | Primary: Student in an Organized Health Care Education/Training Program

## 2019-06-01 NOTE — Progress Notes (Deleted)
06/01/2019      ASSESSMENT:     No diagnosis found.  PLAN:   1. BPH- Continue Flomax 0.4 mg. Happy to provide refills as needed.      2. Elevated PSA  Last PSA was 2.80 ng/ml on 06/03/18. Reviewed trend with patient.  DRE today smooth, no nodules, mildly enlarged.     RTC in 1 year; sooner if difficulty  All questions answered.    Thank you for the opportunity to participate in the care of this nice patient.  CC: Dr. Marlene Lard     There is no height or weight on file to calculate BMI. Patient's BMI is out of the normal parameters.  Information about BMI was given to the patient.   SUBJECTIVE:     CC:   No chief complaint on file.     HPI:  Seth Sandoval is a 64 y.o. male  who presents in follow up for urinary retention and BPH.    He has been doing well over the past year. He continues on Flomax 0.4 mg with continued benefit. Rare nocturia. Good stream and FOS. Denies flank pain, gross hematuria, dysuria, asymptomatic for infection. No f/c/n/v.      Brother has h/o prostate cancer, recently underwent DVP. His PSA was around 4.5 at diagnosis.     PSA   PSA   Latest Ref Rng & Units 4.100 ng/mL   06/03/2018 2.80   08/13/2017 2.65   07/07/2017 4.620 (A)   08/20/2016 1.97   06/26/2016 4.930 (A)   10/15/2015 2.5     *Patient had attended spin class prior to 07/07/17 value.     PRIOR HISTORY:  He was seen in the ER when he felt he could not urinate and foley was placed without difficulty with return of 1200cc urine. 3 days later the foley was removed and the patient now feels that he is not urinating well. He has sensation of incomplete bladder emptying and dribbling stream. Prior to this episode was having some nocturia and decreased FOS but "not like this". He was started on flomax nightly which has subsequently been doubled, as well as an antibiotic. No fevers or chills. He tolerated the catheter poorly.     Presents today to review PSA lab values. He admits to cycling 7-9 days prior to PSA draw. He is voiding well, no difficulties. Nocturia x 0. He reports daytime frequency q 3 hours depending on fluid intake. Intermittent urgency. Continues on Flomax 0.4 mg with benefit. Denies flank pain, gross hematuria, dysuria, asymptomatic for infection. No f/c/n/v.       AUA Symptom Score 06/09/2018   Over the past month how often have you had the sensation that your bladder was not completely empty after you finished urinating? 0   Over the past month, how often have had to urinate again less than 2 hours after you last finished urinating? 0   Over the past month, how often have you found you stopped and started again several times when you urinated? 0   Over the past month, how often have you found it difficult to postpone urination? 0   Over the past month, how often have you had a weak urinary stream? 0   Over the past month, how often have you had to push or strain to begin urinating? 0   Over the past month, how many times did you most typically get up to urinate from the time you went to bed at night until the  time you got up in the morning? 1   AUA Score 1   If you were to spend the rest of your life with your urinary condition the way it is now, how would you feel about that? Delighted       Past Medical History:   Diagnosis Date   ??? Back pain    ??? Benign prostatic hyperplasia with incomplete bladder emptying    ??? Benign prostatic hyperplasia with lower urinary tract symptoms     symptom details unspecified   ??? BPH with obstruction/lower urinary tract symptoms    ??? Herniated disc, cervical    ??? High cholesterol    ??? History of urinary retention    ??? Hypertension    ??? Pilonidal cyst    ??? Prostate cancer screening    ??? Urinary retention      No past surgical history on file.  Current Outpatient Medications   Medication Sig Dispense Refill   ??? clopidogreL (PLAVIX) 75 mg tab Take  by mouth.      ??? simvastatin (ZOCOR) 40 mg tablet 40 mg.     ??? furosemide (LASIX) 40 mg tablet      ??? ibuprofen (MOTRIN) 200 mg tablet 200 mg.     ??? atorvastatin (LIPITOR) 40 mg tablet take 1 tablet by mouth at bedtime for cholesterol  0   ??? dexamethasone (DECADRON) 4 mg tablet   0   ??? lisinopril (PRINIVIL, ZESTRIL) 40 mg tablet take 1 tablet by mouth daily  0   ??? metFORMIN (GLUCOPHAGE) 1,000 mg tablet   0   ??? tamsulosin (FLOMAX) 0.4 mg capsule take 1 capsule by mouth daily AFTER EVENING MEAL FOR URINE FLOW  0     Allergies   Allergen Reactions   ??? Iodine Rash   ??? Shellfish Derived Swelling     Shrimp     Social History     Socioeconomic History   ??? Marital status: DIVORCED     Spouse name: Not on file   ??? Number of children: Not on file   ??? Years of education: Not on file   ??? Highest education level: Not on file   Occupational History   ??? Not on file   Social Needs   ??? Financial resource strain: Not on file   ??? Food insecurity     Worry: Not on file     Inability: Not on file   ??? Transportation needs     Medical: Not on file     Non-medical: Not on file   Tobacco Use   ??? Smoking status: Never Smoker   ??? Smokeless tobacco: Never Used   Substance and Sexual Activity   ??? Alcohol use: Yes   ??? Drug use: No   ??? Sexual activity: Never   Lifestyle   ??? Physical activity     Days per week: Not on file     Minutes per session: Not on file   ??? Stress: Not on file   Relationships   ??? Social Wellsite geologist on phone: Not on file     Gets together: Not on file     Attends religious service: Not on file     Active member of club or organization: Not on file     Attends meetings of clubs or organizations: Not on file     Relationship status: Not on file   ??? Intimate partner violence     Fear of current  or ex partner: Not on file     Emotionally abused: Not on file     Physically abused: Not on file     Forced sexual activity: Not on file   Other Topics Concern   ??? Not on file   Social History Narrative   ??? Not on file      Family History    Family history unknown: Yes     Review of Systems  Constitutional: Fever:   Skin: Rash:   HEENT: Hearing difficulty:   Eyes: Blurred vision:   Cardiovascular: Chest pain:   Respiratory: Shortness of breath:   Gastrointestinal: Nausea/vomiting:   Musculoskeletal: Back pain:   Neurological: Weakness:   Psychological: Memory loss:   Comments/additional findings:     OBJECTIVE:   Physical Exam:   There were no vitals taken for this visit.  General: WNWD, well-appearing male in NAD  DRE 06/03/16- smooth and symmetric without nodules or tenderness, enlarged 2+  DRE 07/13/17: smooth and symmetric without nodules or tenderness  DRE 06/09/18: smooth and symmetric without nodules or tenderness, mildly enlarged.         Review of Labs, Medical Images, tracings or specimens:    Results for orders placed or performed in visit on 06/09/18   AMB POC UROFLOWMETRY   Result Value Ref Range    VOIDING TIME 31.7 Seconds    FLOW TIME 23.6 Seconds    TIME TO MAX 7.6 Seconds    MAX FLOW RATE 10.2 ml/Seconds    AVG FLOW RATE 5.8 ml/Seconds    VOIDED VOLUME 138 ml   AMB POC URINALYSIS DIP STICK AUTO W/O MICRO   Result Value Ref Range    Color (UA POC) Yellow     Clarity (UA POC) Clear     Glucose (UA POC) Negative Negative    Bilirubin (UA POC) Negative Negative    Ketones (UA POC) Negative Negative    Specific gravity (UA POC) 1.025 1.001 - 1.035    Blood (UA POC) Negative Negative    pH (UA POC) 5.0 4.6 - 8.0    Protein (UA POC) Negative Negative    Urobilinogen (UA POC) 0.2 mg/dL 0.2 - 1    Nitrites (UA POC) Negative Negative    Leukocyte esterase (UA POC) Negative Negative   AMB POC PVR, MEAS,POST-VOID RES,US,NON-IMAGING   Result Value Ref Range    PVR 37 cc     Theressa Millard, MD  [facility]  A Division of Urology of [facility] documentation is provided with the assistance of Philis Fendt, Stage manager for [employer]     Any elements of the PMH, FH, SHx, ROS, or preliminary elements of the HPI that were entered by a medical assistant have been reviewed in full.

## 2019-07-08 LAB — HEMOGLOBIN A1C
Estimated Avg Glucose, External: 94 mg/dL (ref 91–123)
Hemoglobin A1C, External: 4.9 % (ref 4.8–5.6)

## 2019-12-25 ENCOUNTER — Telehealth

## 2019-12-25 NOTE — Telephone Encounter (Signed)
Pt called and stated that his pharmact, Erenest Rasher is still waiting for script to refill his Flomax......Marland Kitchenplease refill ... thank you

## 2019-12-27 ENCOUNTER — Encounter

## 2019-12-27 MED ORDER — TAMSULOSIN SR 0.4 MG 24 HR CAP
0.4 mg | ORAL_CAPSULE | ORAL | 3 refills | Status: DC
Start: 2019-12-27 — End: 2020-07-16

## 2019-12-27 MED ORDER — TAMSULOSIN SR 0.4 MG 24 HR CAP
0.4 mg | ORAL_CAPSULE | ORAL | 3 refills | Status: DC
Start: 2019-12-27 — End: 2019-12-27

## 2019-12-27 NOTE — Telephone Encounter (Signed)
LM for pt to call back regarding his appt and refill. Please inform pt I made a F/U appt for him on Thursday, March 14, 2020 02:45 PM and can give him a temporary refill until that appt.  Celeste Pezzano

## 2019-12-27 NOTE — Telephone Encounter (Signed)
Patient called and was told about refill and follow up.   He was needing his medication called into the [facility] off of [address].   It was accidentally called into [facility].     Could this please assist?  Thank you

## 2019-12-27 NOTE — Telephone Encounter (Signed)
rx sent to [facility]

## 2020-03-12 ENCOUNTER — Ambulatory Visit: Attending: Urology

## 2020-03-12 ENCOUNTER — Ambulatory Visit
Admit: 2020-03-12 | Discharge: 2020-03-12 | Attending: Urology | Primary: Student in an Organized Health Care Education/Training Program

## 2020-03-12 DIAGNOSIS — N401 Enlarged prostate with lower urinary tract symptoms: Secondary | ICD-10-CM

## 2020-03-12 LAB — AMB POC URINALYSIS DIP STICK AUTO W/O MICRO
Bilirubin (UA POC): NEGATIVE
Bilirubin, Urine, POC: NEGATIVE
Blood (UA POC): NEGATIVE
Blood (UA POC): NEGATIVE
Glucose (UA POC): NEGATIVE
Glucose, Urine, POC: NEGATIVE
Ketones (UA POC): NEGATIVE
Ketones, Urine, POC: NEGATIVE
Nitrite, Urine, POC: NEGATIVE
Nitrites (UA POC): NEGATIVE
Protein (UA POC): NEGATIVE
Protein, Urine, POC: NEGATIVE
Specific Gravity, Urine, POC: 1.02 NA (ref 1.001–1.035)
Specific gravity (UA POC): 1.02 (ref 1.001–1.035)
Urobilinogen (UA POC): 0.2 (ref 0.2–1)
Urobilinogen, POC: 0.2 (ref 0.2–1)
pH (UA POC): 5.5 (ref 4.6–8.0)
pH, Urine, POC: 5.5 NA (ref 4.6–8.0)

## 2020-03-12 LAB — AMB POC UROFLOWMETRY
AVG FLOW RATE POC: 9.5 ml/Seconds
AVG FLOW RATE: 9.5 ml/Seconds
FLOW TIME POC: 32.5 Seconds
FLOW TIME: 32.5 Seconds
MAX FLOW RATE POC: 17.9 ml/Seconds
MAX FLOW RATE: 17.9 ml/Seconds
TIME TO MAX, POC: 6.3 Seconds
TIME TO MAX, POC: 6.3 Seconds
VOIDED VOLUME POC: 308 ml
VOIDED VOLUME: 308 ml
VOIDING TIME POC: 40.2 Seconds
VOIDING TIME: 40.2 Seconds

## 2020-03-12 LAB — AMB POC PVR, MEAS,POST-VOID RES,US,NON-IMAGING
PVR POC: 77 cc
PVR: 77 cc

## 2020-03-12 MED ORDER — TADALAFIL 5 MG TABLET
5 mg | ORAL_TABLET | Freq: Every day | ORAL | 3 refills | Status: DC
Start: 2020-03-12 — End: 2020-07-12

## 2020-03-12 NOTE — Progress Notes (Signed)
Progress Notes by Gertie Fey, MD at 03/12/20 1445                Author: Gertie Fey, MD  Service: --  Author Type: Physician       Filed: 03/27/20 1613  Encounter Date: 03/12/2020  Status: Signed          Editor: Gertie Fey, MD (Physician)               03/12/2020             ASSESSMENT:          Encounter Diagnoses              ICD-10-CM  ICD-9-CM          1.  Benign prostatic hyperplasia with incomplete bladder emptying   N40.1  600.01           R39.14  788.21          2.  Erectile dysfunction, unspecified erectile dysfunction type   N52.9  607.84          PLAN:     1. BPH-  On Flomax 0.4 mg.     Reviewed UroFlow today 03/12/20 - Max flow rate: 17.9 ml/sec, Voided volume: 308 ml.     PVR today- 77 cc    Discussed starting on Cialis 5 mg daily for both urinary Sx and ED. Rx provided.     Advised pt to d/c Flomax and start on Cialis 5 mg daily. If Sx worsens restart on Flomax 0.4 mg in addition to Cialis 5 mg daily      2.  ED     The pathophysiology and physiology of erections were discussed. Discussed treatment options including medications.  We discussed the potential side effects of these medications  such as headache, facial flushing, GI upset, nasal congestion and priapism.     Start on Cialis 5 mg daily. SE reviewed          3. H/o Elevated PSA    Last PSA was 2.57 ng/ml on 11/08/18.     DRE 06/09/18 smooth, no nodules, mildly enlarged.     Pt reports that he is scheduled to have PSA done in a few days with PCP. F/u depending upon results, PCP can forward if concerning       RTC in 6 months; sooner if difficulty   All questions answered.        Thank you for the opportunity to participate in the care of this nice patient.   CC: Seth Sandoval       Body mass index is 33.63 kg/m??. Patient's BMI is out of the normal parameters.  Information about BMI was given to the patient.    SUBJECTIVE:           CC:    Chief Complaint      Patient presents with      ?  Benign Prostatic  Hypertrophy          Pt state flow and stream are fine, no problems. Pt C/O when he has urge to urinate he has to go then. Pt state he is unable to hold bladder  when he get the urge, however he does not have any accidents.  Pt states he is up once during the night. Pt needs refill on Flomax.      ?  Elevated PSA          Pt already has ordersin  place for  PSA by PCP.      ?  Erectile Dysfunction          Pt is having some problem with getting an erection . Pt states he is able to maintain his erections. Pt has not tried any medications.             HPI:   Seth Sandoval is a 64 y.o.  male  who presents in follow up for urinary retention and BPH.      Patient reports that he is having increase urinary urgency. No UUI. Nocturia x1. He continues on Flomax 0.4 mg daily. He also reports ED. He is having issues achieving and maintaining erection.       Pt reports that he is scheduled to have further treatment for back issues       Brother has h/o prostate cancer, recently underwent DVP. His PSA was around 4.5 at diagnosis.       PSA    PSA      Latest Ref Rng & Units  4.100 ng/mL      11/08/2018  2.57      06/03/2018  2.80      08/13/2017  2.65      07/07/2017  4.620 (A)      08/20/2016  1.97      06/26/2016  4.930 (A)      10/15/2015  2.5           *Patient had attended spin class prior to 07/07/17 value.       PRIOR HISTORY:   He was seen in the ER when he felt he could not urinate and foley was placed without difficulty with return of 1200cc urine. 3 days later the foley was removed and the patient now feels that he is not urinating well. He has sensation of incomplete bladder  emptying and dribbling stream. Prior to this episode was having some nocturia and decreased FOS but "not like this". He was started on flomax nightly which has subsequently been doubled, as well as an antibiotic. No fevers or chills. He tolerated the  catheter poorly.      Presents today to review PSA lab values. He admits to cycling 7-9 days prior to  PSA draw. He is voiding well, no difficulties. Nocturia x 0. He reports daytime frequency q 3 hours depending on fluid intake. Intermittent urgency. Continues on Flomax 0.4  mg with benefit. Denies flank pain, gross hematuria, dysuria, asymptomatic for infection. No f/c/n/v.       He has been doing well over the past year. He continues on Flomax 0.4 mg with continued benefit. Rare nocturia. Good stream and FOS.             AUA Symptom Score  03/12/2020      Over the past month how often have you had the sensation that your bladder was not completely empty after you finished urinating?  2      Over the past month, how often have had to urinate again less than 2 hours after you last finished urinating?  1      Over the past month, how often have you found you stopped and started again several times when you urinated?  0      Over the past month, how often have you found it difficult to postpone urination?  1      Over the past month, how often have you had a weak urinary stream?  0  Over the past month, how often have you had to push or strain to begin urinating?  0      Over the past month, how many times did you most typically get up to urinate from the time you went to bed at night until the time you got  up in the morning?  1      AUA Score  5      If you were to spend the rest of your life with your urinary condition the way it is now, how would you feel about that?  Mixed-about equally satisfied              Past Medical History:      Diagnosis  Date      ?  Back pain        ?  Benign prostatic hyperplasia with incomplete bladder emptying        ?  Benign prostatic hyperplasia with lower urinary tract symptoms          symptom details unspecified      ?  BPH with obstruction/lower urinary tract symptoms        ?  Herniated disc, cervical        ?  High cholesterol        ?  History of urinary retention        ?  Hypertension        ?  Pilonidal cyst        ?  Prostate cancer screening        ?  Urinary  retention             History reviewed. No pertinent surgical history.   Current Outpatient Medications      Medication  Sig  Dispense  Refill      ?  tadalafiL (CIALIS) 5 mg tablet  Take 1 Tablet by mouth daily.  90 Tablet  3      ?  tamsulosin (FLOMAX) 0.4 mg capsule  take 1 capsule by mouth daily AFTER EVENING MEAL FOR URINE FLOW  30 Capsule  3      ?  clopidogreL (PLAVIX) 75 mg tab  Take  by mouth.          ?  simvastatin (ZOCOR) 40 mg tablet  40 mg.          ?  furosemide (LASIX) 40 mg tablet            ?  ibuprofen (MOTRIN) 200 mg tablet  200 mg.          ?  atorvastatin (LIPITOR) 40 mg tablet  take 1 tablet by mouth at bedtime for cholesterol    0      ?  dexamethasone (DECADRON) 4 mg tablet      0      ?  lisinopril (PRINIVIL, ZESTRIL) 40 mg tablet  take 1 tablet by mouth daily    0      ?  metFORMIN (GLUCOPHAGE) 1,000 mg tablet      0           Allergies      Allergen  Reactions      ?  Iodine  Rash      ?  Shellfish Derived  Swelling          Shrimp           Social History           Socioeconomic History      ?  Marital status:  MARRIED          Spouse name:  Not on file      ?  Number of children:  Not on file      ?  Years of education:  Not on file      ?  Highest education level:  Not on file      Occupational History      ?  Not on file      Tobacco Use      ?  Smoking status:  Never Smoker      ?  Smokeless tobacco:  Never Used      Substance and Sexual Activity      ?  Alcohol use:  Yes      ?  Drug use:  No      ?  Sexual activity:  Never      Other Topics  Concern      ?  Not on file      Social History Narrative      ?  Not on file           Social Determinants of Health           Financial Resource Strain:       ?  Difficulty of Paying Living Expenses: Not on file      Food Insecurity:       ?  Worried About Running Out of Food in the Last Year: Not on file      ?  Ran Out of Food in the Last Year: Not on file      Transportation Needs:       ?  Lack of Transportation (Medical): Not on file       ?  Lack of Transportation (Non-Medical): Not on file      Physical Activity:       ?  Days of Exercise per Week: Not on file      ?  Minutes of Exercise per Session: Not on file      Stress:       ?  Feeling of Stress : Not on file      Social Connections:       ?  Frequency of Communication with Friends and Family: Not on file      ?  Frequency of Social Gatherings with Friends and Family: Not on file      ?  Attends Religious Services: Not on file      ?  Active Member of Clubs or Organizations: Not on file      ?  Attends Banker Meetings: Not on file      ?  Marital Status: Not on file      Intimate Partner Violence:       ?  Fear of Current or Ex-Partner: Not on file      ?  Emotionally Abused: Not on file      ?  Physically Abused: Not on file      ?  Sexually Abused: Not on file      Housing Stability:       ?  Unable to Pay for Housing in the Last Year: Not on file      ?  Number of Places Lived in the Last Year: Not on file      ?  Unstable Housing in the Last Year: Not on file  Family History      Family history unknown: Yes           Review of Systems   Constitutional: Fever: No   Skin: Rash: No   HEENT: Hearing difficulty: No   Eyes: Blurred vision: No   Cardiovascular: Chest pain: No   Respiratory: Shortness of breath: No   Gastrointestinal: Nausea/vomiting: No   Musculoskeletal: Back pain: No   Neurological: Weakness: No   Psychological: Memory loss: No   Comments/additional findings:       OBJECTIVE:        Physical Exam:    Visit Vitals   Resp  18      Ht  6' (1.829 m)      Wt  248 lb (112.5 kg)      BMI  33.63 kg/m??           General: WNWD, well-appearing male in NAD   DRE 06/03/16- smooth and symmetric without nodules or tenderness, enlarged 2+   DRE 07/13/17: smooth and symmetric without nodules or tenderness   DRE 06/09/18: smooth and symmetric without nodules or tenderness, mildly enlarged.            Review of Labs,  Medical Images, tracings or specimens:      Results for  orders placed or performed in visit on 03/12/20      AMB POC URINALYSIS DIP STICK AUTO W/O MICRO      Result  Value  Ref Range        Color (UA POC)  Yellow          Clarity (UA POC)  Clear          Glucose (UA POC)  Negative  Negative        Bilirubin (UA POC)  Negative  Negative        Ketones (UA POC)  Negative  Negative        Specific gravity (UA POC)  1.020  1.001 - 1.035        Blood (UA POC)  Negative  Negative        pH (UA POC)  5.5  4.6 - 8.0        Protein (UA POC)  Negative  Negative        Urobilinogen (UA POC)  0.2 mg/dL  0.2 - 1        Nitrites (UA POC)  Negative  Negative        Leukocyte esterase (UA POC)  Trace  Negative      AMB POC PVR, MEAS,POST-VOID RES,US,NON-IMAGING      Result  Value  Ref Range        PVR  77  cc      AMB POC UROFLOWMETRY      Result  Value  Ref Range        VOIDING TIME  40.2  Seconds        FLOW TIME  32.5  Seconds        TIME TO MAX, POC  6.3  Seconds        MAX FLOW RATE  17.9  ml/Seconds        AVG FLOW RATE  9.5  ml/Seconds        VOIDED VOLUME  308  ml           Theressa Millard, MD   [facility]   A Division of [facility] [place]  Documentation provided with the assistance of Emi Belfast, medical scribe for Gertie Fey, MD      Any elements of the PMH, FH, SHx, ROS, or preliminary elements of the HPI that were entered by a medical assistant have been reviewed in full.

## 2020-03-14 ENCOUNTER — Encounter: Attending: Urology | Primary: Student in an Organized Health Care Education/Training Program

## 2020-03-30 ENCOUNTER — Encounter: Payer: Self-pay | Admitting: Emergency Medicine

## 2020-03-30 ENCOUNTER — Emergency Department: Payer: Medicare Other

## 2020-03-30 ENCOUNTER — Emergency Department
Admission: EM | Admit: 2020-03-30 | Discharge: 2020-03-30 | Disposition: A | Discharge status: Home / Self Care | Payer: Medicare Other | Attending: Emergency Medicine | Admitting: Emergency Medicine

## 2020-03-30 ENCOUNTER — Other Ambulatory Visit: Payer: Self-pay

## 2020-03-30 DIAGNOSIS — W01198A Fall on same level from slipping, tripping and stumbling with subsequent striking against other object, initial encounter: Secondary | ICD-10-CM | POA: Insufficient documentation

## 2020-03-30 DIAGNOSIS — W19XXXA Unspecified fall, initial encounter: Secondary | ICD-10-CM

## 2020-03-30 DIAGNOSIS — S0181XA Laceration without foreign body of other part of head, initial encounter: Secondary | ICD-10-CM | POA: Insufficient documentation

## 2020-03-30 DIAGNOSIS — S0993XA Unspecified injury of face, initial encounter: Secondary | ICD-10-CM | POA: Diagnosis present

## 2020-03-30 DIAGNOSIS — S0083XA Contusion of other part of head, initial encounter: Secondary | ICD-10-CM

## 2020-03-30 MED ORDER — LIDOCAINE HCL (PF) 1 % IJ SOLN
5.0000 mL | Freq: Once | INTRAMUSCULAR | Status: DC
  Filled 2020-03-30: qty 5

## 2020-03-30 MED ORDER — LIDOCAINE-EPINEPHRINE-TETRACAINE (LET) TOPICAL GEL
3.0000 mL | Freq: Once | TOPICAL | Status: AC
  Administered 2020-03-30: 3 mL via TOPICAL
  Filled 2020-03-30: qty 3

## 2020-03-30 MED ORDER — LIDOCAINE-EPINEPHRINE 1 %-1:100000 IJ SOLN
20.0000 mL | Freq: Once | INTRAMUSCULAR | Status: AC
  Administered 2020-03-30: 20 mL via INTRADERMAL
  Filled 2020-03-30: qty 20

## 2020-03-30 MED ORDER — CEPHALEXIN 500 MG PO CAPS: 500.0000 mg | ORAL_CAPSULE | Freq: Three times a day (TID) | ORAL | 0 refills | Status: AC

## 2020-03-30 NOTE — ED Provider Notes (Signed)
[facility] Emergency Department Provider Note  ____________________________________________   First MD Initiated Contact with Patient 03/30/20 2129     (approximate)  I have reviewed the triage vital signs and the nursing notes.   HISTORY  Chief Complaint Fall    HPI Cody Mack is a 64 y.o. male presents emergency department with concerns of a fall.  Patient fell and hit the front of his head on concrete prior to arrival had a [employer] football game..  Tdap is up-to-date.  Patient states he stepped on uneven concrete which made him fall.  No vomiting or LOC.    History reviewed. No pertinent past medical history.  There are no problems to display for this patient.   History reviewed. No pertinent surgical history.  Prior to Admission medications   Medication Sig Start Date End Date Taking? Authorizing Provider  cephALEXin (KEFLEX) 500 MG capsule Take 1 capsule (500 mg total) by mouth 3 (three) times daily. 03/30/20   Faythe Ghee, PA-C    Allergies Patient has no known allergies.  History reviewed. No pertinent family history.  Social History Social History   Tobacco Use  . Smoking status: Never Smoker  . Smokeless tobacco: Never Used  Substance Use Topics  . Alcohol use: Not Currently  . Drug use: Never    Review of Systems  Constitutional: No fever/chills Eyes: No visual changes. ENT: No sore throat. Respiratory: Denies cough Cardiovascular: Denies chest pain Genitourinary: Negative for dysuria. Musculoskeletal: Negative for back pain. Skin: Negative for rash. Positive laceration to forehead Psychiatric: no mood changes,     ____________________________________________   PHYSICAL EXAM:  VITAL SIGNS: ED Triage Vitals  Enc Vitals Group     BP 03/30/20 2111 136/74     Pulse Rate 03/30/20 2111 69     Resp 03/30/20 2111 18     Temp 03/30/20 2111 98.5 F (36.9 C)     Temp Source 03/30/20 2111 Oral     SpO2 03/30/20  2111 97 %     Weight 03/30/20 2113 250 lb (113.4 kg)     Height 03/30/20 2113 6\' 1"  (1.854 m)     Head Circumference --      Peak Flow --      Pain Score 03/30/20 2112 3     Pain Loc --      Pain Edu? --      Excl. in GC? --     Constitutional: Alert and oriented. Well appearing and in no acute distress. Eyes: Conjunctivae are normal.  Head: large laceration to right side of forehead, clot noted in wound, some oozing around the clot Nose: No congestion/rhinnorhea. Mouth/Throat: Mucous membranes are moist.   Neck:  supple no lymphadenopathy noted Cardiovascular: Normal rate, regular rhythm.  Respiratory: Normal respiratory effort.  No retractions,  GU: deferred Musculoskeletal: FROM all extremities, warm and well perfused, abrasion to right elbow Neurologic:  Normal speech and language.  Skin:  Skin is warm, dry  No rash noted. + large laceration to forehead Psychiatric: Mood and affect are normal. Speech and behavior are normal.  ____________________________________________   LABS (all labs ordered are listed, but only abnormal results are displayed)  Labs Reviewed - No data to display ____________________________________________   ____________________________________________  RADIOLOGY  CT of the head and C-spine  ____________________________________________   PROCEDURES  Procedure(s) performed:   2113Marland KitchenLaceration Repair  Date/Time: 03/30/2020 11:32 PM Performed by: 13/10/2019, PA-C Authorized by: Faythe Ghee, PA-C   Consent:  Consent obtained:  Verbal   Consent given by:  Patient   Risks discussed:  Infection, pain, retained foreign body, poor cosmetic result, need for additional repair and poor wound healing Anesthesia (see MAR for exact dosages):    Anesthesia method:  Topical application and local infiltration   Topical anesthetic:  LET   Local anesthetic:  Lidocaine 1% WITH epi Laceration details:    Location:  Face   Face location:   Forehead   Length (cm):  5 Repair type:    Repair type:  Simple Pre-procedure details:    Preparation:  Patient was prepped and draped in usual sterile fashion Exploration:    Hemostasis achieved with:  Epinephrine, LET and direct pressure   Wound exploration: wound explored through full range of motion     Wound extent: no areolar tissue violation noted, no foreign bodies/material noted, no muscle damage noted, no nerve damage noted, no underlying fracture noted and no vascular damage noted   Treatment:    Area cleansed with:  Betadine and saline   Amount of cleaning:  Standard   Irrigation solution:  Sterile saline   Irrigation method:  Syringe and tap Mucous membrane repair:    Suture size:  5-0   Suture material:  Vicryl   Suture technique:  Simple interrupted   Number of sutures:  3 Skin repair:    Repair method:  Sutures   Suture size:  5-0   Suture material:  Nylon   Suture technique:  Simple interrupted   Number of sutures:  9 Approximation:    Approximation:  Close Post-procedure details:    Dressing:  Non-adherent dressing   Patient tolerance of procedure:  Tolerated well, no immediate complications      ____________________________________________   INITIAL IMPRESSION / ASSESSMENT AND PLAN / ED COURSE  Pertinent labs & imaging results that were available during my care of the patient were reviewed by me and considered in my medical decision making (see chart for details).   Patient 64 year old male presents after a fall.  See HPI.  Physical exam shows a large laceration to the right side of the forehead.  DDx: Facial laceration, facial bone fracture, TBI, subdural   CT of the head and cervical spine ordered from triage.  I did review the CT images, I do not see any acute abnormalities.  Radiologist read the area with a large hematoma on the forehead due to the laceration but no skull fracture or intracranial abnormality.  See procedure note for  repair  Patient was given head instructions and laceration care instructions.  Explained to him that due to the large hematoma he may need an additional repair of the facial laceration by plastic surgeon.  He is to apply ice throughout the night.  Take Tylenol for pain.  Return if worsening.  Use the antibiotic as prescribed.  Have the sutures removed in 1 week.  He was discharged in stable condition in the care of his wife.  Tdap is up-to-date so we did not do an additional Tdap at this time.  Cody Mack was evaluated in Emergency Department on 03/30/2020 for the symptoms described in the history of present illness. He was evaluated in the context of the global COVID-19 pandemic, which necessitated consideration that the patient might be at risk for infection with the SARS-CoV-2 virus that causes COVID-19. Institutional protocols and algorithms that pertain to the evaluation of patients at risk for COVID-19 are in a state of rapid change based on information  released by regulatory bodies including the CDC and federal and state organizations. These policies and algorithms were followed during the patient's care in the ED.    As part of my medical decision making, I reviewed the following data within the electronic MEDICAL RECORD NUMBER Nursing notes reviewed and incorporated, Old chart reviewed, Radiograph reviewed , Notes from prior ED visits and [place] Controlled Substance Database  ____________________________________________   FINAL CLINICAL IMPRESSION(S) / ED DIAGNOSES  Final diagnoses:  Fall, initial encounter  Facial laceration, initial encounter  Contusion of face, initial encounter      NEW MEDICATIONS STARTED DURING THIS VISIT:  New Prescriptions   CEPHALEXIN (KEFLEX) 500 MG CAPSULE    Take 1 capsule (500 mg total) by mouth 3 (three) times daily.     Note:  This document was prepared using Dragon voice recognition software and may include unintentional dictation errors.    Faythe Ghee, PA-C 03/30/20 3570    Delton Prairie, MD 03/31/20 [phone]

## 2020-03-30 NOTE — Discharge Instructions (Signed)
Follow-up with your regular doctor for suture removal in 1 week.  Follow-up with a plastic surgeon if you do not feel the area is well aligned after the hematoma starts to shrink.  If the hematoma is getting larger please return emergency department.  Apply ice to the forehead.  Take Tylenol for pain.  Keep the area as dry as possible.  You may shower tomorrow but try to keep the facial laceration as dry as possible.  You have been given an antibiotic to prevent infection due to the fall on concrete.

## 2020-03-30 NOTE — ED Notes (Signed)
EMS M-21 will bring pt's cane asap.

## 2020-03-30 NOTE — ED Triage Notes (Signed)
Pt reports no LOC and last tetanus in Dec 2020

## 2020-03-30 NOTE — ED Triage Notes (Signed)
FIRST NURSE NOTE: Pt arrived via [facility] with reports of tripping falling at [facility] game, lac right eye, witnessed fall, no blood thinners, takes 81mg  ASA daily.

## 2020-07-12 ENCOUNTER — Encounter

## 2020-07-12 MED ORDER — TADALAFIL 5 MG TABLET
5 mg | ORAL_TABLET | Freq: Every day | ORAL | 3 refills | Status: AC
Start: 2020-07-12 — End: ?

## 2020-07-12 NOTE — Telephone Encounter (Signed)
Patient called in requesting a refill on Cialis to be sent to pharmacy on file. According to [facility] patient is to continue medication, refill sent.

## 2020-07-12 NOTE — Telephone Encounter (Signed)
error 

## 2020-07-16 MED ORDER — TAMSULOSIN SR 0.4 MG 24 HR CAP
0.4 mg | ORAL_CAPSULE | Freq: Every day | ORAL | 3 refills | Status: DC
Start: 2020-07-16 — End: 2021-04-23

## 2020-07-16 MED ORDER — TAMSULOSIN SR 0.4 MG 24 HR CAP
0.4 mg | ORAL_CAPSULE | ORAL | 3 refills | Status: DC
Start: 2020-07-16 — End: 2021-04-23

## 2020-07-24 LAB — HEMOGLOBIN A1C
Estimated Avg Glucose, External: 204 mg/dL — ABNORMAL HIGH (ref 91–123)
Hemoglobin A1C, External: 8.8 % — ABNORMAL HIGH (ref 4.8–5.6)

## 2020-08-16 DIAGNOSIS — L03115 Cellulitis of right lower limb: Secondary | ICD-10-CM

## 2020-08-17 ENCOUNTER — Inpatient Hospital Stay: Admit: 2020-08-19 | Payer: Self-pay | Primary: Student in an Organized Health Care Education/Training Program

## 2020-08-17 ENCOUNTER — Inpatient Hospital Stay: Admit: 2020-08-17 | Payer: Self-pay | Primary: Student in an Organized Health Care Education/Training Program

## 2020-08-17 DIAGNOSIS — I10 Essential (primary) hypertension: Secondary | ICD-10-CM

## 2020-08-17 LAB — CBC WITH AUTO DIFFERENTIAL
Basophils %: 1.4 % (ref 0–3)
Eosinophils %: 5.8 % — ABNORMAL HIGH (ref 0–5)
Hematocrit: 32.1 % — ABNORMAL LOW (ref 37.0–50.0)
Hemoglobin: 10.1 gm/dl — ABNORMAL LOW (ref 12.4–17.2)
Immature Granulocytes: 2.5 % (ref 0.0–3.0)
Lymphocytes %: 28.5 % (ref 28–48)
MCH: 31.8 pg (ref 23.0–34.6)
MCHC: 31.5 gm/dl (ref 30.0–36.0)
MCV: 100.9 fL — ABNORMAL HIGH (ref 80.0–98.0)
MPV: 9.5 fL (ref 6.0–10.0)
Monocytes %: 11.5 % (ref 1–13)
Neutrophils %: 50.3 % (ref 34–64)
Nucleated RBCs: 0 (ref 0–0)
Platelets: 427 10*3/uL (ref 140–450)
RBC: 3.18 M/uL — ABNORMAL LOW (ref 3.80–5.70)
RDW-SD: 58.2 — ABNORMAL HIGH (ref 35.1–43.9)
WBC: 8.1 10*3/uL (ref 4.0–11.0)

## 2020-08-17 LAB — BASIC METABOLIC PANEL
Anion Gap: 11 mmol/L (ref 5–15)
BUN: 10 mg/dl (ref 7–25)
CO2: 24 mEq/L (ref 21–32)
Calcium: 8.8 mg/dl (ref 8.5–10.1)
Chloride: 104 mEq/L (ref 98–107)
Creatinine: 2.2 mg/dl — ABNORMAL HIGH (ref 0.6–1.3)
EGFR IF NonAfrican American: 32
GFR African American: 39
Glucose: 90 mg/dl (ref 74–106)
Potassium: 3.7 mEq/L (ref 3.5–5.1)
Sodium: 139 mEq/L (ref 136–145)

## 2020-08-17 LAB — CBC WITH AUTOMATED DIFF
BASOPHILS: 1.4 % (ref 0–3)
EOSINOPHILS: 5.8 % — ABNORMAL HIGH (ref 0–5)
HCT: 32.1 % — ABNORMAL LOW (ref 37.0–50.0)
HGB: 10.1 gm/dl — ABNORMAL LOW (ref 12.4–17.2)
IMMATURE GRANULOCYTES: 2.5 % (ref 0.0–3.0)
LYMPHOCYTES: 28.5 % (ref 28–48)
MCH: 31.8 pg (ref 23.0–34.6)
MCHC: 31.5 gm/dl (ref 30.0–36.0)
MCV: 100.9 fL — ABNORMAL HIGH (ref 80.0–98.0)
MONOCYTES: 11.5 % (ref 1–13)
MPV: 9.5 fL (ref 6.0–10.0)
NEUTROPHILS: 50.3 % (ref 34–64)
NRBC: 0 (ref 0–0)
PLATELET: 427 10*3/uL (ref 140–450)
RBC: 3.18 M/uL — ABNORMAL LOW (ref 3.80–5.70)
RDW-SD: 58.2 — ABNORMAL HIGH (ref 35.1–43.9)
WBC: 8.1 10*3/uL (ref 4.0–11.0)

## 2020-08-17 LAB — METABOLIC PANEL, BASIC
Anion gap: 11 mmol/L (ref 5–15)
BUN: 10 mg/dl (ref 7–25)
CO2: 24 mEq/L (ref 21–32)
Calcium: 8.8 mg/dl (ref 8.5–10.1)
Chloride: 104 mEq/L (ref 98–107)
Creatinine: 2.2 mg/dl — ABNORMAL HIGH (ref 0.6–1.3)
GFR est AA: 39
GFR est non-AA: 32
Glucose: 90 mg/dl (ref 74–106)
Potassium: 3.7 mEq/L (ref 3.5–5.1)
Sodium: 139 mEq/L (ref 136–145)

## 2020-08-18 LAB — URINALYSIS W/ RFLX MICROSCOPIC
Bilirubin, Urine: NEGATIVE
Bilirubin: NEGATIVE
Blood, Urine: NEGATIVE
Blood: NEGATIVE
Glucose, Ur: NEGATIVE mg/dl
Glucose: NEGATIVE mg/dl
Leukocyte Esterase, Urine: NEGATIVE
Leukocyte Esterase: NEGATIVE
Nitrite, Urine: NEGATIVE
Nitrites: NEGATIVE
Protein, UA: NEGATIVE mg/dl
Protein: NEGATIVE mg/dl
Specific Gravity, UA: >=1.03 (ref 1.005–1.030)
Specific gravity: >=1.03 (ref 1.005–1.030)
Urobilinogen, UA, POCT: 0.2 mg/dl (ref 0.0–1.0)
Urobilinogen: 0.2 mg/dl (ref 0.0–1.0)
pH (UA): 5 (ref 5.0–9.0)
pH, UA: 5 (ref 5.0–9.0)

## 2020-08-19 LAB — CULTURE, URINE
CULTURE RESULT: NO GROWTH
Culture Result: NO GROWTH

## 2020-08-20 ENCOUNTER — Inpatient Hospital Stay: Admit: 2020-08-20 | Payer: Self-pay | Primary: Student in an Organized Health Care Education/Training Program

## 2020-08-20 DIAGNOSIS — R531 Weakness: Secondary | ICD-10-CM

## 2020-08-20 LAB — CBC WITH AUTO DIFFERENTIAL
Basophils %: 1.6 % (ref 0–3)
Eosinophils %: 6.5 % — ABNORMAL HIGH (ref 0–5)
Hematocrit: 33.1 % — ABNORMAL LOW (ref 37.0–50.0)
Hemoglobin: 10.2 gm/dl — ABNORMAL LOW (ref 12.4–17.2)
Immature Granulocytes: 1.9 % (ref 0.0–3.0)
Lymphocytes %: 29.6 % (ref 28–48)
MCH: 31.8 pg (ref 23.0–34.6)
MCHC: 30.8 gm/dl (ref 30.0–36.0)
MCV: 103.1 fL — ABNORMAL HIGH (ref 80.0–98.0)
MPV: 9.4 fL (ref 6.0–10.0)
Monocytes %: 12.3 % (ref 1–13)
Neutrophils %: 48.1 % (ref 34–64)
Nucleated RBCs: 0 (ref 0–0)
Platelets: 475 10*3/uL — ABNORMAL HIGH (ref 140–450)
RBC: 3.21 M/uL — ABNORMAL LOW (ref 3.80–5.70)
RDW-SD: 57.6 — ABNORMAL HIGH (ref 35.1–43.9)
WBC: 7.7 10*3/uL (ref 4.0–11.0)

## 2020-08-20 LAB — CBC WITH AUTOMATED DIFF
BASOPHILS: 1.6 % (ref 0–3)
EOSINOPHILS: 6.5 % — ABNORMAL HIGH (ref 0–5)
HCT: 33.1 % — ABNORMAL LOW (ref 37.0–50.0)
HGB: 10.2 gm/dl — ABNORMAL LOW (ref 12.4–17.2)
IMMATURE GRANULOCYTES: 1.9 % (ref 0.0–3.0)
LYMPHOCYTES: 29.6 % (ref 28–48)
MCH: 31.8 pg (ref 23.0–34.6)
MCHC: 30.8 gm/dl (ref 30.0–36.0)
MCV: 103.1 fL — ABNORMAL HIGH (ref 80.0–98.0)
MONOCYTES: 12.3 % (ref 1–13)
MPV: 9.4 fL (ref 6.0–10.0)
NEUTROPHILS: 48.1 % (ref 34–64)
NRBC: 0 (ref 0–0)
PLATELET: 475 10*3/uL — ABNORMAL HIGH (ref 140–450)
RBC: 3.21 M/uL — ABNORMAL LOW (ref 3.80–5.70)
RDW-SD: 57.6 — ABNORMAL HIGH (ref 35.1–43.9)
WBC: 7.7 10*3/uL (ref 4.0–11.0)

## 2020-08-21 LAB — BASIC METABOLIC PANEL
Anion Gap: 7 mmol/L (ref 5–15)
BUN: 8 mg/dl (ref 7–25)
CO2: 27 mEq/L (ref 21–32)
Calcium: 9 mg/dl (ref 8.5–10.1)
Chloride: 106 mEq/L (ref 98–107)
Creatinine: 1.6 mg/dl — ABNORMAL HIGH (ref 0.6–1.3)
EGFR IF NonAfrican American: 46
GFR African American: 56
Glucose: 109 mg/dl — ABNORMAL HIGH (ref 74–106)
Potassium: 3.5 mEq/L (ref 3.5–5.1)
Sodium: 140 mEq/L (ref 136–145)

## 2020-08-21 LAB — METABOLIC PANEL, BASIC
Anion gap: 7 mmol/L (ref 5–15)
BUN: 8 mg/dl (ref 7–25)
CO2: 27 mEq/L (ref 21–32)
Calcium: 9 mg/dl (ref 8.5–10.1)
Chloride: 106 mEq/L (ref 98–107)
Creatinine: 1.6 mg/dl — ABNORMAL HIGH (ref 0.6–1.3)
GFR est AA: 56
GFR est non-AA: 46
Glucose: 109 mg/dl — ABNORMAL HIGH (ref 74–106)
Potassium: 3.5 mEq/L (ref 3.5–5.1)
Sodium: 140 mEq/L (ref 136–145)

## 2020-09-12 ENCOUNTER — Encounter: Attending: Urology | Primary: Student in an Organized Health Care Education/Training Program

## 2020-09-12 ENCOUNTER — Encounter: Attending: Nurse Practitioner | Primary: Student in an Organized Health Care Education/Training Program

## 2020-09-27 NOTE — Telephone Encounter (Signed)
Established patient of Dr. Allena Earing stated he went to the pharmacy and was given Flomax and Cialis. He thought it should only be one medication. He is requesting a return call from a nurse to discuss and can be reached @ [phone].

## 2020-09-27 NOTE — Telephone Encounter (Signed)
Pt call to get clarification on flomax and cialis . Pt was informed per note below.    Advised pt to d/c Flomax and start on Cialis 5 mg daily. If Sx worsens restart on Flomax 0.4 mg in addition to Cialis 5 mg daily    Pt understood.

## 2020-10-08 ENCOUNTER — Encounter: Attending: Nurse Practitioner | Primary: Student in an Organized Health Care Education/Training Program

## 2020-11-14 ENCOUNTER — Inpatient Hospital Stay: Payer: MEDICARE | Primary: Student in an Organized Health Care Education/Training Program

## 2020-11-14 DIAGNOSIS — E1165 Type 2 diabetes mellitus with hyperglycemia: Secondary | ICD-10-CM

## 2020-11-18 ENCOUNTER — Encounter: Payer: MEDICARE | Primary: Student in an Organized Health Care Education/Training Program

## 2020-11-18 NOTE — Progress Notes (Signed)
Sondra Barges [facility] - Outpatient Nutrition Services  [address] [place], [place] [address]  Phone: [phone] Fax: [phone]   Nutrition Assessment - Medical Nutrition Therapy   Outpatient Initial Evaluation         Patient Name: Seth Sandoval DOB: 1956-05-15   Treatment Diagnosis: diabetes   Referral Source: Vella Kohler, MD Start of Care [facility]): 11/18/2020     Gender: male Age: 65 y.o.   Ht: 73 In Wt: 255 lb  kg   BMI: 33.6 BMR   Male  BMR Male      Past Medical History:  Cellulitis; obesity;      Pertinent Medications:   n/a     Biochemical Data:   A1c 8.8 (3-22);       Lab Results   Component Value Date/Time    Sodium 140 08/20/2020 12:17 PM    Potassium 3.5 08/20/2020 12:17 PM    Chloride 106 08/20/2020 12:17 PM    CO2 27 08/20/2020 12:17 PM    Anion gap 7 08/20/2020 12:17 PM    Glucose 109 (H) 08/20/2020 12:17 PM    BUN 8 08/20/2020 12:17 PM    Creatinine 1.6 (H) 08/20/2020 12:17 PM    GFR est AA 56.0 08/20/2020 12:17 PM    GFR est non-AA 46 08/20/2020 12:17 PM    Calcium 9.0 08/20/2020 12:17 PM     No results found for: CHOL, CHOLPOCT, CHOLX, CHLST, CHOLV, TOTCHOLEXT, HDL, HDLPOC, HDLEXT, HDLP, LDL, LDLCPOC, LDLCEXT, LDLC, DLDLP, VLDLC, VLDL, TGLX, TRIGL, TRIGLYCEXT, TRIGP, TGLPOCT, CHHD, CHHDX  No results found for: ALT, AST, GGT, GGTP, AP, APIT, APX, CBIL, TBIL, TBILI  Lab Results   Component Value Date/Time    Creatinine 1.6 (H) 08/20/2020 12:17 PM     Lab Results   Component Value Date/Time    BUN 8 08/20/2020 12:17 PM     No results found for: MCACR, MCA1, MCA2, MCA3, MCAU, MCAU2, MCALPOCT     Assessment:    Pt referred to nutrition counseling for assistance with blood sugar management and weight loss.  Pt hospitalized with cellulitis in Dec 2020, then moved to rehab for 1 month resulting in 60 lb weight loss.  Pt regained all weight and hospitalized with cellulitis Dec 2021.  Pt lost 35-40 lbs and has since regained all the weight.  Pt lives with his wife who is prediabetic and  interested in losing weight also.  Pt in PT x 2d/wk and exercises with a trainer at [employer] x 5d/wk.  Pt GBW 225.  Pt currently not taking any medications to manage BGs but will see endo again.  Pt wore CGM prior to hospitalization but no longer using and does not check BGs at home. Pt agreed to all goals and recommendations provided.       Food & Nutrition: B- meal replacement powder + granola + yogurt + [employer] or [employer]  L- Zero's tuna sub  D- chicken or steak + potato or corn  Sn (HS and overnight)- fruit    Pt's diet inconsistent in macronutrient composition and meal timing, leading to inefficient RMR.  Pt reports he does not consume alcohol and does not like most vegetables but loves fruit.       Estimate Needs   Calories: 1800  Protein: 135 Carbs: 180 Fat: 60   Kcal/day  g/day  g/day  g/day        percent: 30  40  30  Nutrition Diagnosis Nutrition Diagnosis: Food and nutrition related knowledge deficit related to carbohydrate and meal planning for DM type 2 as evidenced by no previous education provided on DM diet.       Nutrition Intervention &  Education: Educated pt on the pathophysiology of Type II Diabetes, insulin resistance and the rationale for dietary modifications and increased activity. Educated pt macronutrient composition of various foods and provided specific recommendations for intake at each scheduled meal and snack.  Also discussed the importance of establishing a consistent lifestyle and eating schedule to promote a more efficient metabolism.  Briefly reviewed nutrition label reading.   Handouts Provided: []   Carbohydrates  []   Protein  []   Non-starchy Vegatbles  []   Food Label  []   Meal and Snack Ideas  []   Food Journals []   Diabetes  []   Cholesterol  []   Sodium  [x]   Meal Builder  [x]   Diet Plan  []   Others:   Information Reviewed with: Pt and pt's wife, Larita Fife   Readiness to Change Stage: []   Pre-contemplative    []   Contemplative  [x]   Preparation                []   Action                  []   Maintenance   Potential Barriers to Learning: []   Decline in memory    []   Language barrier   []   Other:  []   Emotional                  []   Limited mobility  []   Lack of motivation     []  Vision, hearing or cognitive impairment   Expected Compliance: / Fair /      Nutritional Goal - To promote lifestyle changes to result in:    [x]   Weight loss  [x]   Improved diabetic control  []   Decreased cholesterol levels  []   Decreased blood pressure  []   Weight maintenance []   Preventing any interactions associated with food allergies  []   Adequate weight gain toward goal weight  []   Other:        Patient Goals:   1. Balance carb and protein at every meal/snack  2. Eat 2-3 hrs after snacks and 4-5 hrs after meals     Dietitian Signature: Jackolyn Confer, MS, RD Date: 11/18/2020   Follow-up: 7/27 Time: 1:43 PM

## 2020-12-18 ENCOUNTER — Encounter: Payer: MEDICARE | Primary: Student in an Organized Health Care Education/Training Program

## 2020-12-25 ENCOUNTER — Inpatient Hospital Stay: Admit: 2020-12-25 | Payer: MEDICARE | Primary: Student in an Organized Health Care Education/Training Program

## 2020-12-25 DIAGNOSIS — E1165 Type 2 diabetes mellitus with hyperglycemia: Secondary | ICD-10-CM

## 2020-12-25 NOTE — Progress Notes (Signed)
NUTRITION - FOLLOW-UP TREATMENT NOTE  Patient Name: Seth Sandoval         Date: 01/02/2021  DOB: 18-Feb-1956    YES/NO Patient DOB Verified  Diagnosis: diabetes; morbid obesity   In time:   1230             Out time:   100   Total Treatment Time (min):   30     SUBJECTIVE/ASSESSMENT    Current Wt: 268 Previous Wt: 255 Wt Change:      Initial Wt:  Total Wt change:        Changes in medication or medical history? Any new allergies, surgeries or procedures?    YES/NO    If yes, update Summary List   Pt to DC metformin and begin metformin xr 1000 mg qd             Nutrition Diagnosis        Diagnosis Status:   Nutrition Diagnosis: Food and nutrition related knowledge deficit related to carbohydrate and meal planning for DM type 2 as evidenced by no previous education provided on DM diet.        []   Improved []   No Change    [x]   Declined        Nutrition Monitoring and Evaluation: Per CGM, pt's BGs >180 consistently throughout the day, with sharpest increase around 1:00 pm and drop around 4-5:00 pm. Pt's diet recall indicates pt mostly following prescribed meal plan, although he struggles to eat in the morning before exercise.  Pt also suffering from cellulitis/open sore on his leg, which may also contribute to increase in BGs. Discussed the possibility of increasing/adding medications to help stabilize BGs during the day, especially in the afternoons.  Pt to call endo to schedule FU appt.       Nutrition Prescription and  Intervention 1700-1800 cal/d  160-170g protein/d  125-130g carb/d       Patient Education:  [x]   Review current plan with patient   []   Other:    Handouts/  Information Provided: []   Carbohydrates  []   Protein  []   Fiber  []   Serving Sizes  []   Fluids  []   General guidelines []   Diabetes  []   Cholesterol  []   Sodium  []   SBGM  []   Food Journals  []   Others:        Patient Goals Eat within 1 hr after waking  Move afternoon snack to morning instead and push lunch to 1-2:00       PLAN    [x]   Continue on  current plan []   Follow-up PRN   []   Discharge due to :    [x]   Next appt: 9/14     Dietitian: Jackolyn Confer, MS, RD    Date: 01/02/2021 Time: 11:06 AM

## 2021-02-05 ENCOUNTER — Encounter: Payer: MEDICARE | Primary: Student in an Organized Health Care Education/Training Program

## 2021-02-05 NOTE — Progress Notes (Signed)
 NUTRITION - FOLLOW-UP TREATMENT NOTE  Patient Name: Seth Sandoval         Date: 02/06/2021  DOB: 01-30-1956    YES/NO Patient DOB Verified  Diagnosis: diabetes   In time:   100             Out time:   130   Total Treatment Time (min):   30     SUBJECTIVE/ASSESSMENT    Current Wt:  Previous Wt:  Wt Change:      Initial Wt:  Total Wt change:        Changes in medication or medical history? Any new allergies, surgeries or procedures?    YES/    If yes, update Summary List   Pt prescribed new oral and topical antibiotic to treat cellulosis and other infection             Nutrition Diagnosis      Diagnosis Status: Nutrition Diagnosis: Food and nutrition related knowledge deficit related to carbohydrate and meal planning for DM type 2 as evidenced by no previous education provided on DM diet.      []   Improved [x]   No Change    []   Declined        Nutrition Monitoring and Evaluation: Pt met with infectious disease who prescribed new topical and oral antibiotic regimen to treat infection.  Pt reports infection improved and BGs pre meals all lower, now ranging 100-110 (down from 160-180).  Pt c/o spikes in BGs, up to 200-225, after eating, regardless of the time or content of his meals.  Suggested pt FU with endo to discuss medication, as changes in diet do not seem to improve post prandials. Pt's diet recall indicates his daily intake remains inconsistent, esp in carbohdyrate content.  Discussed the importance of consistency in both timing and macronutrient content for managing both BGs and weight.  Pt frustrated by lack of weight loss.  He continues to exercise daily (inlet fitness) and/or receive PT. Pt indicated his wife plans to move (herself) toward a more plant-based diet.  Discussed plant-based sources of protein and also discussed the benefits of eating fish and seafood for anti-inflammation purposes.         Patient Education:  [x]   Review current plan with patient   []   Other:    Handouts/  Information Provided:  []   Carbohydrates  []   Protein  []   Fiber  []   Serving Sizes  []   Fluids  []   General guidelines []   Diabetes  []   Cholesterol  []   Sodium  []   SBGM  []   Food Journals  []   Others:        Patient Goals Drink only zero beverages  Fulfill daily budget of carbohydrate and protein  Eat at scheduled times       PLAN    [x]   Continue on current plan []   Follow-up PRN   []   Discharge due to :    [x]   Next appt: 11/2     Dietitian: Tinnie Sires, MS, RD    Date: 02/06/2021 Time: 10:32 AM

## 2021-03-07 LAB — HEMOGLOBIN A1C
Estimated Avg Glucose, External: 153 mg/dL — ABNORMAL HIGH (ref 91–123)
Hemoglobin A1C, External: 7 % — ABNORMAL HIGH (ref 4.8–5.6)

## 2021-03-26 ENCOUNTER — Inpatient Hospital Stay: Admit: 2021-03-26 | Payer: MEDICARE | Primary: Student in an Organized Health Care Education/Training Program

## 2021-03-26 DIAGNOSIS — E1165 Type 2 diabetes mellitus with hyperglycemia: Secondary | ICD-10-CM

## 2021-03-26 NOTE — Progress Notes (Signed)
NUTRITION - FOLLOW-UP TREATMENT NOTE  Patient Name: Seth Sandoval         Date: 03/26/2021  DOB: Jun 03, 1955    YES/NO Patient DOB Verified  Diagnosis: diabetes   In time:   100             Out time:   130   Total Treatment Time (min):   30     SUBJECTIVE/ASSESSMENT    Current Wt:  Previous Wt:  Wt Change:      Initial Wt:  Total Wt change:        Changes in medication or medical history? Any new allergies, surgeries or procedures?    YES    If yes, update Summary List   Updated labs:   A1c 7.0 (8.8); TSH  6.53            Nutrition Diagnosis        Diagnosis Status: Nutrition Diagnosis: Food and nutrition related knowledge deficit related to carbohydrate and meal planning for DM type 2 as evidenced by no previous education provided on DM diet.      [x]   Improved []   No Change    []   Declined        Nutrition Monitoring and Evaluation: Pt's updated labwork indicates improved BG management over past 8 months.  However, pt continues to c/o fatigue and struggle with wound healing. Pt reports he will start new medication, Ozempic 0.5mg , tomorrow. Pt's diet recall indicates he follows meal plan as prescribed (other than 1 week cruise), but may skip a snack occasionally.  Pt's lack of weight loss does not appear to be a result of overeating.  Unable to take an accurate weight R/T excessive edema throughout his body. Pt continues to work toward his goals and follows guidance from all providers.         Patient Education:  [x]   Review current plan with patient   []   Other:    Handouts/  Information Provided: []   Carbohydrates  []   Protein  []   Fiber  []   Serving Sizes  []   Fluids  []   General guidelines []   Diabetes  []   Cholesterol  []   Sodium  []   SBGM  []   Food Journals  []   Others:        Patient Goals        PLAN    []   Continue on current plan []   Follow-up PRN   [x]   Discharge due to : Outpatient nutrition services DC effective Nov 18   []   Next appt:      Dietitian: , MS, RD    Date: 03/26/2021 Time: 1:34 PM

## 2021-04-23 ENCOUNTER — Ambulatory Visit
Admit: 2021-04-23 | Discharge: 2021-04-23 | Payer: MEDICARE | Attending: Urology | Primary: Student in an Organized Health Care Education/Training Program

## 2021-04-23 DIAGNOSIS — R339 Retention of urine, unspecified: Secondary | ICD-10-CM

## 2021-04-23 LAB — AMB POC URINALYSIS DIP STICK AUTO W/O MICRO
Bilirubin (UA POC): NEGATIVE
Bilirubin, Urine, POC: NEGATIVE
Blood (UA POC): NEGATIVE
Blood (UA POC): NEGATIVE
Glucose (UA POC): NEGATIVE
Glucose, Urine, POC: NEGATIVE
Ketones (UA POC): NEGATIVE
Ketones, Urine, POC: NEGATIVE
Leukocyte Esterase, Urine, POC: NEGATIVE
Leukocyte esterase (UA POC): NEGATIVE
Nitrite, Urine, POC: NEGATIVE
Nitrites (UA POC): NEGATIVE
Protein (UA POC): NEGATIVE
Protein, Urine, POC: NEGATIVE
Specific Gravity, Urine, POC: 1.02 NA (ref 1.001–1.035)
Specific gravity (UA POC): 1.02 (ref 1.001–1.035)
Urobilinogen (UA POC): 0.2 (ref 0.2–1)
Urobilinogen, POC: 0.2 (ref 0.2–1)
pH (UA POC): 5.5 (ref 4.6–8.0)
pH, Urine, POC: 5.5 NA (ref 4.6–8.0)

## 2021-04-23 LAB — AMB POC PVR, MEAS,POST-VOID RES,US,NON-IMAGING
PVR POC: 1247 cc
PVR: 1247 cc

## 2021-04-23 MED ORDER — DUTASTERIDE 0.5 MG CAP
0.5 mg | ORAL_CAPSULE | Freq: Every day | ORAL | 3 refills | Status: AC
Start: 2021-04-23 — End: ?

## 2021-04-23 MED ORDER — TAMSULOSIN SR 0.4 MG 24 HR CAP
0.4 mg | ORAL_CAPSULE | Freq: Every day | ORAL | 3 refills | Status: AC
Start: 2021-04-23 — End: ?

## 2021-04-23 NOTE — Progress Notes (Signed)
Seth Sandoval is a 65 y.o. male is here for a catheter insertion due to urinary retention per Dr. Janalyn Rouse order.    Dr. Janalyn Rouse was present in the clinic as incident to.     Visit Vitals  Resp 16   Ht 6' (1.829 m)   Wt 248 lb (112.5 kg)   BMI 33.63 kg/m        Procedure:  The genital and perineal areas were cleansed with antiseptic solution.  With clean gloves, I applied sterile lubricant liberally to the catheter tip, lubricating at least six inches of the catheter. Antiseptic solution was used on the cotton balls.  The entire glans was cleansed again.   The penis was held at 90-degree angle. The new indwelling catheter was advanced into the meatus.  The sphincter was allowed to relax. The penis was lowered and the catheter was advanced.  Balloon was inflated using 10cc of sterile water. Catheter was connected to drainage system and secured.  Follow up appointment was made.      Catheter size:  54F  Type:  Coude  Material:  Latex    Volume drained:   Urine was:  clear    Specimen was obtained:  No  Drainage bag:   leg Bag      Patient did not bring their own catheter supplies.    Encounter Diagnoses   Name Primary?    Urinary retention Yes    Benign prostatic hyperplasia with incomplete bladder emptying     Erectile dysfunction, unspecified erectile dysfunction type        Orders Placed This Encounter    Foley Insert /Change Simple    AMB POC URINALYSIS DIP STICK AUTO W/O MICRO    AMB POC PVR, MEAS,POST-VOID RES,US,NON-IMAGING    A4358 - PR URINARY LEG OR ABDOMEN BAG    cholecalciferol (VITAMIN D3) (1000 Units /25 mcg) tablet     Sig: Take 1,000 Units by mouth daily.    cyanocobalamin (VITAMIN B12) 100 mcg tablet     Sig: Take 50 mcg by mouth daily.    cyclobenzaprine (FLEXERIL) 10 mg tablet     Sig: 1 TABLET BY MOUTH THREE TIMES A DAY AS NEEDED MUSCLE RELAXER FOR ACUTE BACK PAIN    glipiZIDE SR (GLUCOTROL XL) 5 mg CR tablet     Sig: glipizide ER 5 mg tablet, extended release 24 hr    levothyroxine (SYNTHROID)  25 mcg tablet    methylPREDNISolone (MEDROL) 4 mg tab    nystatin (MYCOSTATIN) topical cream     Sig: nystatin 100,000 unit/gram topical cream    Ozempic 0.25 mg or 0.5 mg/dose (2 mg/1.5 ml) subq pen    dutasteride (AVODART) 0.5 mg capsule     Sig: Take 1 Capsule by mouth daily (after dinner).     Dispense:  90 Capsule     Refill:  3    tamsulosin (FLOMAX) 0.4 mg capsule     Sig: Take 2 Capsules by mouth daily (after dinner).     Dispense:  90 Capsule     Refill:  3       Jacquelyn Ozimek, LPN     I have read and agree with the documentation and plan as outlined above. I was present and available in clinic.    Donah Driver, MD, FACS

## 2021-04-23 NOTE — Progress Notes (Signed)
Progress Notes by Donah Driver, MD at 04/23/21 0945                Author: Donah Driver, MD  Service: --  Author Type: Physician       Filed: 05/04/21 1759  Encounter Date: 04/23/2021  Status: Signed          Editor: Donah Driver, MD (Physician)                                          Seth Sandoval   10-May-1956   male                ICD-10-CM  ICD-9-CM             1.  Urinary retention   R33.9  788.20  AMB POC URINALYSIS DIP STICK AUTO W/O MICRO                AMB POC PVR, MEAS,POST-VOID RES,US,NON-IMAGING           INSERT,TEMP INDWELLING BLAD CATH,SIMPLE           PR URINARY LEG OR ABDOMEN BAG                  2.  Benign prostatic hyperplasia with incomplete bladder emptying   N40.1  600.01  AMB POC URINALYSIS DIP STICK AUTO W/O MICRO            R39.14  788.21  AMB POC PVR, MEAS,POST-VOID RES,US,NON-IMAGING           INSERT,TEMP INDWELLING BLAD CATH,SIMPLE           PR URINARY LEG OR ABDOMEN BAG                  3.  Erectile dysfunction, unspecified erectile dysfunction type   N52.9  607.84  AMB POC URINALYSIS DIP STICK AUTO W/O MICRO                INSERT,TEMP INDWELLING BLAD CATH,SIMPLE           PR URINARY LEG OR ABDOMEN BAG                      ASSESSMENT:      1. BPH-  On Flomax 0.8 mg and Avodart 0.5 mg daily (started 04/23/21).     UroFlow 03/12/20 - Max flow rate: 17.9 ml/sec, Voided volume: 308 ml.     Currently on Cialis 5 mg daily, previously on Flomax  0.4 mg daily.       2.  ED     Currently on Cialis 5 mg daily.       3. H/o Elevated PSA    DRE 06/09/18 smooth, no nodules, mildly enlarged.    Most recent PSA was 2.36 ng/mL on 03/07/2021     DRE 04/23/21: 3+ enlarged benign       4. Urinary Retention     PVR 04/23/21: 1,247 cc. After trying to void: 1,113 cc    Catheter placed 04/23/21         PLAN:     ??  UA today - negative for infection and blood    ??  PVR today - 1,113 cc, prior to voiding it was 1,247 cc   ??  Recommend patient to have catheter in place for one week. Patient is  in  agreement with this.    ??  Advised patient to push fluids while having catheter.    ??  Discussed etiology of acute urinary retention.   ??  Start Flomax 0.8 mg daily. R/B and potential SEs reviewed. Rx sent.    ??  Start Avodart 0.5 mg daily. R/B and potential SEs reviewed. Rx sent.    ??  Reviewed most recent PSA was 2.36 ng/mL on 03/07/21    ??  Continue Cialis 5 mg daily. Happy to provide refill    ??  Nurse visit in 1 week for VT. I will come in to talk to him at this visit and will decide follow up at that time.               Chief Complaint       Patient presents with        ?  Urinary Retention     ?  Benign Prostatic Hypertrophy     ?  (LUTS) Lower Urinary Tract Symptoms        ?  Erectile Dysfunction           HISTORY OF PRESENT ILLNESS:  Seth Sandoval is a 65 y.o. male who presents today in consultation for BPH, ED, and h/o elevated PSA. He is an established  patient of Dr.Delong.       Patient is having issues voiding today.      States on 04/15/21 he was traveling to [place] and had hurt his back. A day after he started having trouble voiding.    Denies dysuria.             PSA TREND      PSA /TESTOSTERONE - BSHSI  PSA     Latest Ref Rng & Units  0.00 - 4.00 ng/mL        03/07/2021  2.36     04/10/2020  2.99     06/03/2018  2.80     08/13/2017  2.65        07/07/2017  4.620 (A)              AUA Symptom Score  03/12/2020        Over the past month how often have you had the sensation that your bladder was not completely empty after you finished urinating?  2        Over the past month, how often have had to urinate again less than 2 hours after you last finished urinating?  1     Over the past month, how often have you found you stopped and started again several times when you urinated?  0     Over the past month, how often have you found it difficult to postpone urination?  1     Over the past month, how often have you had a weak urinary stream?  0     Over the past month, how often have you had to push or  strain to begin urinating?  0     Over the past month, how many times did you most typically get up to urinate from the time you went to bed at night until the time you got up in  the morning?  1     AUA Score  5        If you were to spend the rest of your life with your urinary condition the way it is now, how would you feel about that?  Mixed-about  equally satisfied             Past Medical History:        Diagnosis  Date         ?  Back pain       ?  Benign prostatic hyperplasia with incomplete bladder emptying       ?  Benign prostatic hyperplasia with lower urinary tract symptoms            symptom details unspecified         ?  BPH with obstruction/lower urinary tract symptoms       ?  Herniated disc, cervical       ?  High cholesterol       ?  History of urinary retention       ?  Hypertension       ?  Pilonidal cyst       ?  Prostate cancer screening           ?  Urinary retention             History reviewed. No pertinent surgical history.        Social History          Tobacco Use         ?  Smoking status:  Never     ?  Smokeless tobacco:  Never       Substance Use Topics         ?  Alcohol use:  Yes         ?  Drug use:  No             Allergies        Allergen  Reactions         ?  Iodine  Rash     ?  Shellfish Derived  Swelling             Shrimp             Family History       Family history unknown: Yes             Current Outpatient Medications          Medication  Sig  Dispense  Refill           ?  cholecalciferol (VITAMIN D3) (1000 Units /25 mcg) tablet  Take 1,000 Units by mouth daily.         ?  cyanocobalamin (VITAMIN B12) 100 mcg tablet  Take 50 mcg by mouth daily.         ?  cyclobenzaprine (FLEXERIL) 10 mg tablet  1 TABLET BY MOUTH THREE TIMES A DAY AS NEEDED MUSCLE RELAXER FOR ACUTE BACK PAIN         ?  glipiZIDE SR (GLUCOTROL XL) 5 mg CR tablet  glipizide ER 5 mg tablet, extended release 24 hr         ?  levothyroxine (SYNTHROID) 25 mcg tablet           ?  methylPREDNISolone (MEDROL) 4  mg tab           ?  nystatin (MYCOSTATIN) topical cream  nystatin 100,000 unit/gram topical cream         ?  Ozempic 0.25 mg or 0.5 mg/dose (2 mg/1.5 ml) subq pen           ?  dutasteride (AVODART) 0.5 mg capsule  Take 1 Capsule by mouth daily (  after dinner).  90 Capsule  3     ?  tamsulosin (FLOMAX) 0.4 mg capsule  Take 2 Capsules by mouth daily (after dinner).  90 Capsule  3     ?  tadalafiL (CIALIS) 5 mg tablet  Take 1 Tablet by mouth daily.  90 Tablet  3     ?  clopidogreL (PLAVIX) 75 mg tab  Take  by mouth.         ?  simvastatin (ZOCOR) 40 mg tablet  40 mg.         ?  furosemide (LASIX) 40 mg tablet           ?  ibuprofen (MOTRIN) 200 mg tablet  200 mg.         ?  atorvastatin (LIPITOR) 40 mg tablet  take 1 tablet by mouth at bedtime for cholesterol    0     ?  dexamethasone (DECADRON) 4 mg tablet      0           ?  lisinopril (PRINIVIL, ZESTRIL) 40 mg tablet  take 1 tablet by mouth daily    0           ?  metFORMIN (GLUCOPHAGE) 1,000 mg tablet      0           ?  levoFLOXacin (Levaquin) 750 mg tablet  Take 1 Tablet by mouth daily for 7 days.  7 Tablet  0              Review of Systems   Constitutional: Fever: No   Skin: Rash: No   HEENT: Hearing difficulty: No   Eyes: Blurred vision: No   Cardiovascular: Chest pain: No   Respiratory: Shortness of breath: No   Gastrointestinal: Nausea/vomiting: No   Musculoskeletal: Back pain: No   Neurological: Weakness: No   Psychological: Memory loss: No   Comments/additional findings:          Visit Vitals      Resp  16     Ht  6' (1.829 m)     Wt  248 lb (112.5 kg)        BMI  33.63 kg/m??        Constitutional: WDWN, Pleasant and appropriate affect, No acute distress.     CV:  No peripheral swelling noted   Respiratory: No respiratory distress or difficulties   GI:  No abdominal masses or tenderness.  No CVA tenderness. No inguinal hernias noted.    GU Male:  (04/23/21)    UVO:[id number] normal to visual inspection. Sphincter with good tone, Rectum with no masses.   Prostate is 3+ benign     SCROTUM:  No scrotal rash or lesions.  Normal bilateral testes and epididymis.    PENIS: Urethral meatus normal in location and size. No urethral discharge.   Skin: No cyanosis or jaundice.     Neuro/Psych:  Alert and oriented x 4. Affect appropriate.    MSK: Ambulatory          REVIEW OF LABS AND IMAGING:          Results for orders placed or performed in visit on 04/23/21     AMB POC URINALYSIS DIP STICK AUTO W/O MICRO         Result  Value  Ref Range            Color (UA POC)  Yellow         Clarity (  UA POC)  Clear         Glucose (UA POC)  Negative  Negative       Bilirubin (UA POC)  Negative  Negative       Ketones (UA POC)  Negative  Negative       Specific gravity (UA POC)  1.020  1.001 - 1.035       Blood (UA POC)  Negative  Negative       pH (UA POC)  5.5  4.6 - 8.0       Protein (UA POC)  Negative  Negative       Urobilinogen (UA POC)  0.2 mg/dL  0.2 - 1       Nitrites (UA POC)  Negative  Negative       Leukocyte esterase (UA POC)  Negative  Negative       AMB POC PVR, MEAS,POST-VOID RES,US,NON-IMAGING         Result  Value  Ref Range            PVR  1,247  cc              Donah Driver, MD   [facility] [place], [facility]      Medical documentation is provided with the assistance of Tarry Kos, medical scribe for Donah Driver, MD on 04/23/2021

## 2021-04-25 NOTE — Telephone Encounter (Signed)
Spoke to pt and explained how to changeover to night time bag.

## 2021-04-25 NOTE — Telephone Encounter (Signed)
Patient states he needs direction on how to snap on his cath bag

## 2021-04-30 ENCOUNTER — Institutional Professional Consult (permissible substitution)
Admit: 2021-04-30 | Discharge: 2021-04-30 | Payer: MEDICARE | Primary: Student in an Organized Health Care Education/Training Program

## 2021-04-30 DIAGNOSIS — R339 Retention of urine, unspecified: Secondary | ICD-10-CM

## 2021-04-30 LAB — AMB POC PVR, MEAS,POST-VOID RES,US,NON-IMAGING
PVR POC: 550 cc
PVR: 550 cc

## 2021-04-30 NOTE — Progress Notes (Signed)
 Patient here for voiding trial per Dr. Westly order.   Dr. Arminda is present in the clinic as incident to.     Visit Vitals  Temp 97.8 F (36.6 C)   Ht 6' (1.829 m)   Wt 248 lb (112.5 kg)   BMI 33.63 kg/m        Procedure:    Patient was placed in supine position.  I have visualized the urine in the bag and recorded it as being clear  Under clean technique, 400 ccs of sterile water was instilled into patient's bladder via catheter/syringe at gravity.   Patient is advised that there will be a slight burning during removal of the catheter.  The balloon was emptied by inserting the barrel of the syringe and withdrawing the amount of fluid used during inflation. The catheter was gently grasped and removed without difficulty. The area was examined and no skin breakdown was noted.    Patient voided 150 ccs of clear urine.    Patient instructed to either return to our office or call our office back by 3 pm to let us  know how He is doing/voiding.   I spent approximately 15 minutes with the patient today explaining the importance of notifying our office if He is unable to void. Patient also knows to report any symptoms of fever, chills, urinary frequency, burning, dribbling, hesitation in starting the stream of urine as well as cloudiness or any other unusual color or characteristic of the urine to our office.  Patient is made fully aware that if they are unable to void that they will need to return to our office prior to the close of business today or to the ER if they are unable to void after hours.  Patient verbalized understanding of the above.  Questions were answered.        Patient came back to office for PVR.     Orders Placed This Encounter    PR IRRIGATION OF BLADDER     Seth Sandoval presents today for PVR per Dr. Westly order.  Patient's identity has been verified.   Dr. Arminda was available in the clinic as incident to provider.     Patient states that He has emptied their bladder to the best of their ability.      PVR today is 550.       Results for orders placed or performed in visit on 04/30/21   AMB POC PVR, MEAS,POST-VOID RES,US ,NON-IMAGING   Result Value Ref Range    PVR 550 cc       Orders Placed This Encounter    Foley Insert /Change Simple    AMB POC PVR, MEAS,POST-VOID RES,US ,NON-IMAGING    PR IRRIGATION OF BLADDER    A4358 - PR URINARY LEG OR ABDOMEN BAG     Seth Sandoval is a 65 y.o. male is here for a catheter insertion due to urinary retention per Dr. Westly order.    Dr. Arminda was present in the clinic as incident to.     Visit Vitals  Temp 97.8 F (36.6 C)   Ht 6' (1.829 m)   Wt 248 lb (112.5 kg)   BMI 33.63 kg/m        Procedure:  The genital and perineal areas were cleansed with antiseptic solution.  With clean gloves, I applied sterile lubricant liberally to the catheter tip, lubricating at least six inches of the catheter. Antiseptic solution was used on the cotton balls.  The entire glans was cleansed  again.   The penis was held at 90-degree angle. The new indwelling catheter was advanced into the meatus.  The sphincter was allowed to relax. The penis was lowered and the catheter was advanced.  Balloon was inflated using 10cc of sterile water. Catheter was connected to drainage system and secured.  Follow up appointment was made.      Catheter size:  33F  Type:  Coude  Material:  Latex    Volume drained:   Urine was:  Cloudy    Specimen was obtained:  Yes  Drainage bag:   Leg bag      Patient did not bring their own catheter supplies.    Encounter Diagnosis   Name Primary?    Urinary retention Yes       Orders Placed This Encounter    Foley Insert /Change Simple    AMB POC PVR, MEAS,POST-VOID RES,US ,NON-IMAGING    PR IRRIGATION OF BLADDER    A4358 - PR URINARY LEG OR ABDOMEN BAG          Jacquelyn Ozimek, LPN       I have reviewed and agree with above assessment and plan.  Norleen CANDIE Blake, MD, FACS  04/30/2021

## 2021-04-30 NOTE — Progress Notes (Signed)
Urine culture returned positive. Recommend Levaquin daily x7 days only if symptomatic

## 2021-05-02 LAB — URINE C&S

## 2021-05-02 MED ORDER — LEVOFLOXACIN 750 MG TAB
750 mg | ORAL_TABLET | Freq: Every day | ORAL | 0 refills | Status: AC
Start: 2021-05-02 — End: 2021-05-09

## 2021-05-02 NOTE — Telephone Encounter (Signed)
L/m for pt to c/b, medication sent to pharmacy.     ----- Message from Haynes Kerns, NP sent at 05/02/2021 11:31 AM EST -----  Urine culture returned positive. Recommend Levaquin daily x7 days only if symptomatic

## 2021-05-09 ENCOUNTER — Ambulatory Visit
Admit: 2021-05-09 | Discharge: 2021-05-09 | Payer: MEDICARE | Primary: Student in an Organized Health Care Education/Training Program

## 2021-05-09 DIAGNOSIS — R339 Retention of urine, unspecified: Secondary | ICD-10-CM

## 2021-05-09 NOTE — Progress Notes (Signed)
Progress  Notes by Saunders Glance, PA-C at 05/09/21 0840                Author: Saunders Glance, PA-C  Service: --  Author Type: Physician Assistant       Filed: 06/29/21 1223  Encounter Date: 05/09/2021  Status: Signed          Editor: Saunders Glance, PA-C (Physician Assistant)                                   Seth Sandoval   November 21, 1955   male         ASSESSMENT:      1. BPH   On Flomax 0.8 mg and Avodart 0.5 mg daily (started 04/23/21).   UroFlow 03/12/20 - Max flow rate: 17.9 ml/sec, Voided volume: 308 ml.    Currently on Cialis 5 mg daily, previously on Flomax  0.4 mg daily.       2.  ED     Currently on Cialis 5 mg daily.       3. H/o Elevated PSA   DRE 06/09/18 smooth, no nodules, mildly enlarged. Most recent PSA was 2.36 ng/mL on 03/07/2021. DRE 04/23/21: 3+ enlarged benign       4. Urinary Retention     PVR 04/23/21: 1,247 cc. After trying to void: 1,113 cc. Catheter placed 04/23/21    Passed VT today         PLAN:        ??  PVR today- < 200 cc    ??  Continue Flomax 0.8 mg daily.- Happy to refill, prn   ??  Continue Avodart 0.5 mg daily. Happy to refill, prn   ??  Reviewed most recent PSA was 2.36 ng/mL on 03/07/21    ??  Continue Cialis 5 mg daily. Happy to provide refill    ??  Will rediscuss BPH screening at next   ??  RTC  in 2 months for a PVR and reevaluation of symptoms, or sooner if needed             CC:  2nd attempt at voiding trial         HISTORY OF PRESENT ILLNESS:  Seth Sandoval is a 65 y.o. male  with a h/o BPH, ED, elevated PSA urinary retention 2/2 UTI who presents today for  VT. Today, the patient reports doing okay. He is interested in repeat a 2nd VT since he failed his most recent one on 04/30/21. He stated he completed a 7-day course of Levaquin  for a recent UTI. He is asymptomatic for infection. Denies flank pain, gross  hematuria, dysuria, or frequency. No f/c/n/v.          Prior GU Hx:   Patient is having issues voiding today. States on 04/15/21 he was traveling to [place] and had  hurt his back. A day after he started having trouble voiding. Denies dysuria.       PSA TREND      PSA /TESTOSTERONE - [facility]SI  PSA        Latest Ref Rng & Units  0.00 - 4.00 ng/mL        03/07/2021  2.36     04/10/2020  2.99     06/03/2018  2.80     08/13/2017  2.65        07/07/2017  4.620 (A)  AUA Symptom Score  03/12/2020        Over the past month how often have you had the sensation that your bladder was not completely empty after you finished urinating?  2     Over the past month, how often have had to urinate again less than 2 hours after you last finished urinating?  1     Over the past month, how often have you found you stopped and started again several times when you urinated?  0     Over the past month, how often have you found it difficult to postpone urination?  1     Over the past month, how often have you had a weak urinary stream?  0     Over the past month, how often have you had to push or strain to begin urinating?  0     Over the past month, how many times did you most typically get up to urinate from the time you went to bed at night until the time you got up in  the morning?  1     AUA Score  5        If you were to spend the rest of your life with your urinary condition the way it is now, how would you feel about that?  Mixed-about equally satisfied             Past Medical History:        Diagnosis  Date         ?  Back pain       ?  Benign prostatic hyperplasia with incomplete bladder emptying       ?  Benign prostatic hyperplasia with lower urinary tract symptoms            symptom details unspecified         ?  BPH with obstruction/lower urinary tract symptoms       ?  Herniated disc, cervical       ?  High cholesterol       ?  History of urinary retention       ?  Hypertension       ?  Pilonidal cyst       ?  Prostate cancer screening           ?  Urinary retention             No past surgical history on file.        Social History          Tobacco Use         ?  Smoking status:   Never     ?  Smokeless tobacco:  Never       Substance Use Topics         ?  Alcohol use:  Yes         ?  Drug use:  No             Allergies        Allergen  Reactions         ?  Iodine  Rash     ?  Shellfish Derived  Swelling             Shrimp             Family History       Family history unknown: Yes  Current Outpatient Medications          Medication  Sig  Dispense  Refill           ?  levoFLOXacin (Levaquin) 750 mg tablet  Take 1 Tablet by mouth daily for 7 days.  7 Tablet  0     ?  cholecalciferol (VITAMIN D3) (1000 Units /25 mcg) tablet  Take 1,000 Units by mouth daily.         ?  cyanocobalamin (VITAMIN B12) 100 mcg tablet  Take 50 mcg by mouth daily.         ?  cyclobenzaprine (FLEXERIL) 10 mg tablet  1 TABLET BY MOUTH THREE TIMES A DAY AS NEEDED MUSCLE RELAXER FOR ACUTE BACK PAIN         ?  glipiZIDE SR (GLUCOTROL XL) 5 mg CR tablet  glipizide ER 5 mg tablet, extended release 24 hr         ?  levothyroxine (SYNTHROID) 25 mcg tablet           ?  methylPREDNISolone (MEDROL) 4 mg tab           ?  nystatin (MYCOSTATIN) topical cream  nystatin 100,000 unit/gram topical cream         ?  Ozempic 0.25 mg or 0.5 mg/dose (2 mg/1.5 ml) subq pen           ?  dutasteride (AVODART) 0.5 mg capsule  Take 1 Capsule by mouth daily (after dinner).  90 Capsule  3     ?  tamsulosin (FLOMAX) 0.4 mg capsule  Take 2 Capsules by mouth daily (after dinner).  90 Capsule  3     ?  tadalafiL (CIALIS) 5 mg tablet  Take 1 Tablet by mouth daily.  90 Tablet  3     ?  clopidogreL (PLAVIX) 75 mg tab  Take  by mouth.         ?  simvastatin (ZOCOR) 40 mg tablet  40 mg.         ?  furosemide (LASIX) 40 mg tablet           ?  ibuprofen (MOTRIN) 200 mg tablet  200 mg.         ?  atorvastatin (LIPITOR) 40 mg tablet  take 1 tablet by mouth at bedtime for cholesterol    0     ?  dexamethasone (DECADRON) 4 mg tablet      0     ?  lisinopril (PRINIVIL, ZESTRIL) 40 mg tablet  take 1 tablet by mouth daily    0           ?  metFORMIN  (GLUCOPHAGE) 1,000 mg tablet      0              Review of Systems   Constitutional: Fever:    Skin: Rash:    HEENT: Hearing difficulty:    Eyes: Blurred vision:    Cardiovascular: Chest pain:    Respiratory: Shortness of breath:    Gastrointestinal: Nausea/vomiting:    Musculoskeletal: Back pain:    Neurological: Weakness:    Psychological: Memory loss:    Comments/additional findings:          There were no vitals taken for this visit.      Constitutional: WDWN, Pleasant  male.No acute distress.     CV:  No peripheral swelling noted   Respiratory: Normal respiratory efforts   GU Male:  (04/23/21)  MOQ:[id number] normal to visual inspection. Sphincter with good tone, Rectum with no masses.  Prostate is 3+ benign     SCROTUM:  No scrotal rash or lesions.  Normal bilateral testes and epididymis.    PENIS: Urethral meatus normal in location and size. No urethral discharge.    05/09/21-Foley draining yellow urine.   Skin: No cyanosis or jaundice.     Neuro/Psych:  Alert and oriented x 4. Affect appropriate.    MSK: Ambulatory          REVIEW OF LABS AND IMAGING:        No results found for any visits on 05/09/21.      Saunders Glance, MPA, PA-C   [facility] [place]   [address], [place] [address]   Office: [phone]   Fax: [phone]

## 2021-05-09 NOTE — Progress Notes (Signed)
 DESHAN HEMMELGARN is a 65 y.o. male who is here today for cath removal per Clarita Mater, PA order.   Dr. Arsenio,, was available in the clinic as incident to provider.     Visit Vitals  Resp 16   Ht 6' 0.01 (1.829 m)   Wt 265 lb (120.2 kg)   BMI 35.93 kg/m       This is a UROLOGICAL post-op catheter removal: NO           If above is yes, inside of global period: NO     Patient is advised that there will be a slight burning during removal of the catheter. Privacy was provided.         Patient was placed in supine position.     Remove 72fr coude    Procedure:    Under clean technique, the balloon was emptied by inserting the barrel of the syringe and withdrawing the amount of fluid used during inflation. The catheter was gently grasped and removed without difficulty. The area was examined and no skin breakdown was noted.   I have visualized the urine and recorded it as being clear     Patient instructed to return to the office at 3pm for PVR.      Encounter Diagnoses   Name Primary?    Urinary retention Yes    Benign prostatic hyperplasia with incomplete bladder emptying     Erectile dysfunction, unspecified erectile dysfunction type        Eulogio Craig Lamar JINNY Rosenthal presents today for PVR per Clarita Mater, PA order.  Patient's identity has been verified.   Dr. Arsenio was available in the clinic as incident to provider.     Patient states that He has emptied their bladder to the best of their ability.     PVR today is 101.       Results for orders placed or performed in visit on 04/30/21   URINE C&S    Specimen: Urine   Result Value Ref Range    FINAL REPORT Microbiology results (A)    AMB POC PVR, MEAS,POST-VOID RES,US ,NON-IMAGING   Result Value Ref Range    PVR 550 cc       Orders Placed This Encounter    AMB POC PVR, MEAS,POST-VOID RES,US ,NON-IMAGING    AMB POC URINALYSIS DIP STICK AUTO W/O MICRO       Eulogio Craig

## 2021-05-14 NOTE — Telephone Encounter (Signed)
Patient called in requesting a call back from Seth Sandoval, patient stated that he had some additional questions concerning the VT. No other information was provided by patient.

## 2021-05-15 NOTE — Telephone Encounter (Signed)
Spoke to pt and he stated he received a steroid injection in his back and noticed he was going to the bathroom more. He said he thinks its starting to return to normal today. He will call us back if his symptoms do not improve or worsen.

## 2021-06-27 IMAGING — CT CT CERVICAL SPINE W/O CM
3 of 4 series · 11 of 33 positions shown, 13 images · non-contrast
Comparison: None.

CLINICAL DATA: Neck trauma (Age >= 65y) Trip and fall. Right eye
laceration.

EXAM:
CT CERVICAL SPINE WITHOUT CONTRAST
TECHNIQUE: Multidetector CT imaging of the cervical spine was performed without
intravenous contrast. Multiplanar CT image reconstructions were also
generated.

[Series 7: orthogonal bone · axial · 0.29mm/px · z∈[-338,-203]mm · 3 of 111 slices shown, 4 images]
[im 19/111  soft-tissue]
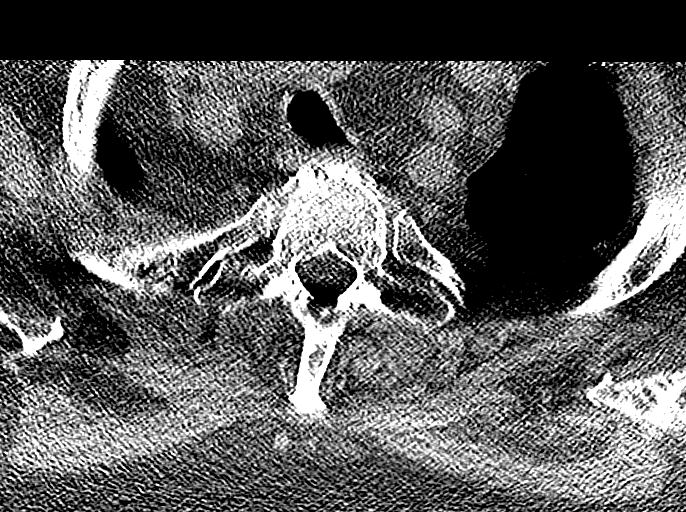
[im 19/111  bone]
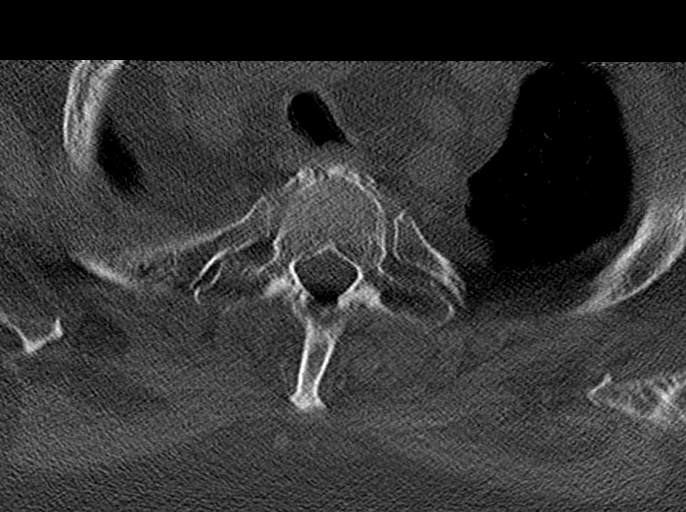
[im 56/111  bone]
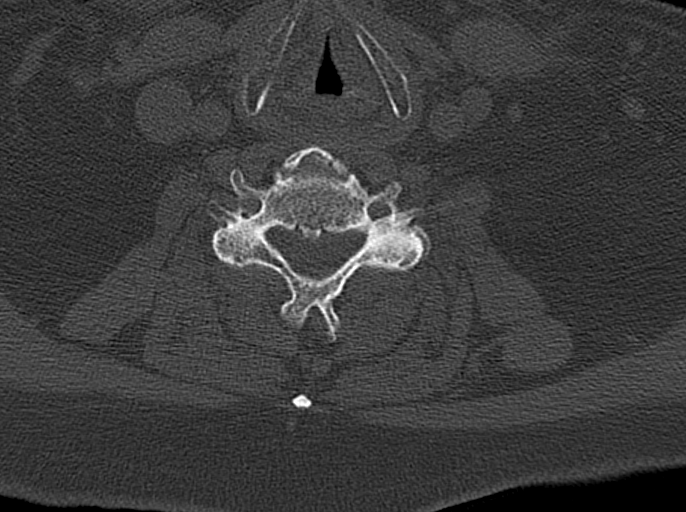
[im 92/111  bone]
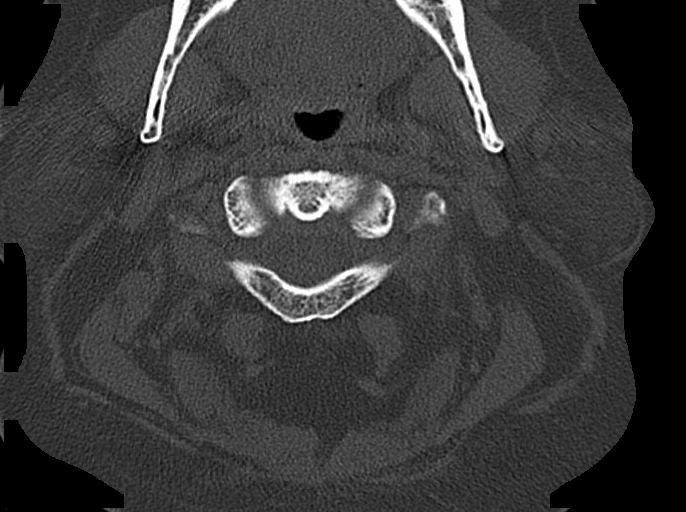

[Series 8: sagittal bone · sagittal · 0.29mm/px · 5 of 104 slices shown, 6 images]
[im 35/104  bone]
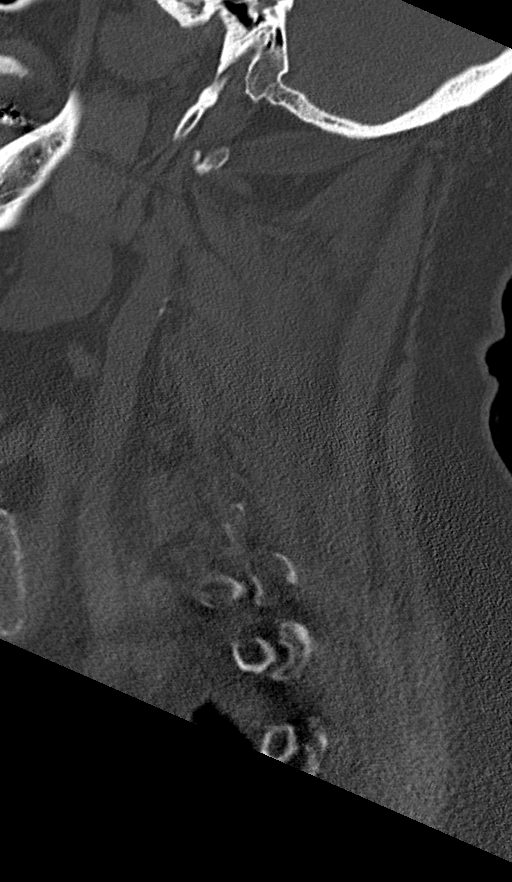
[im 43/104  bone]
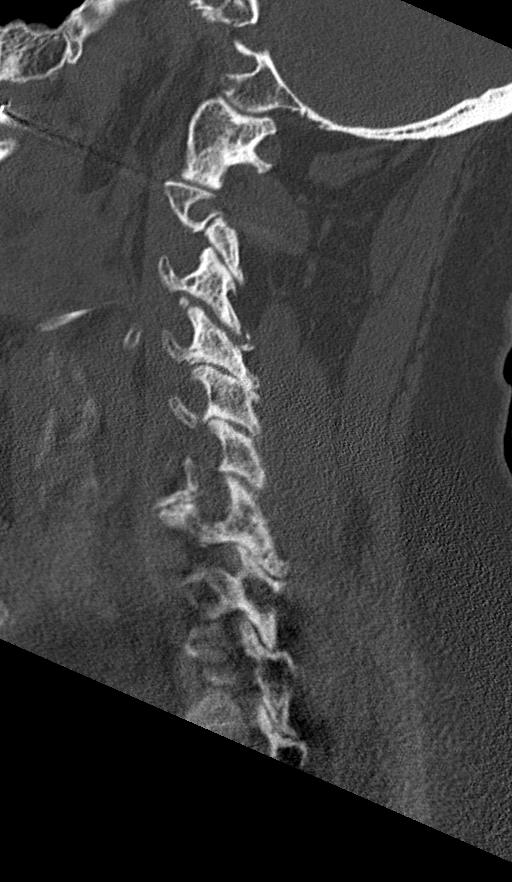
[im 52/104  soft-tissue]
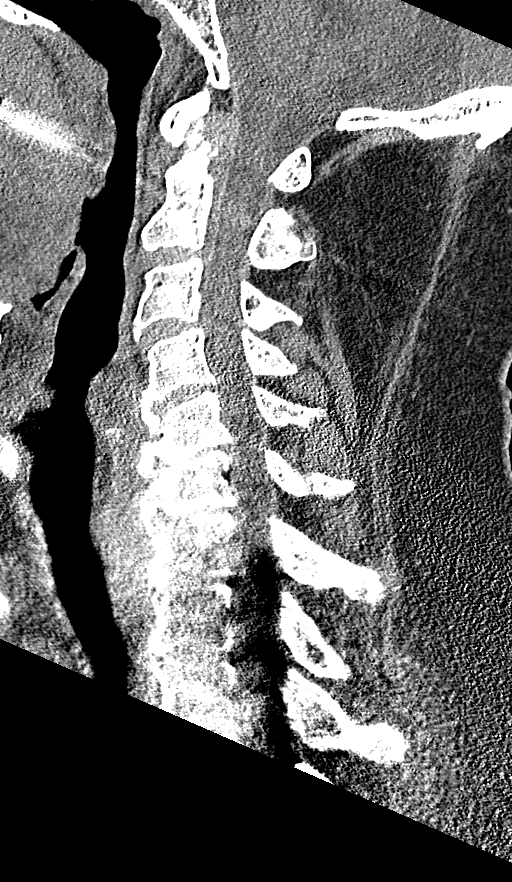
[im 52/104  bone]
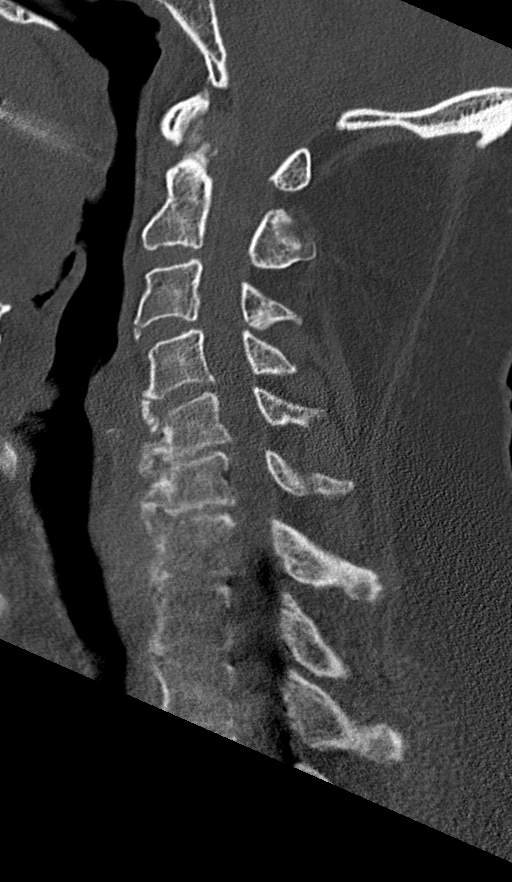
[im 61/104  bone]
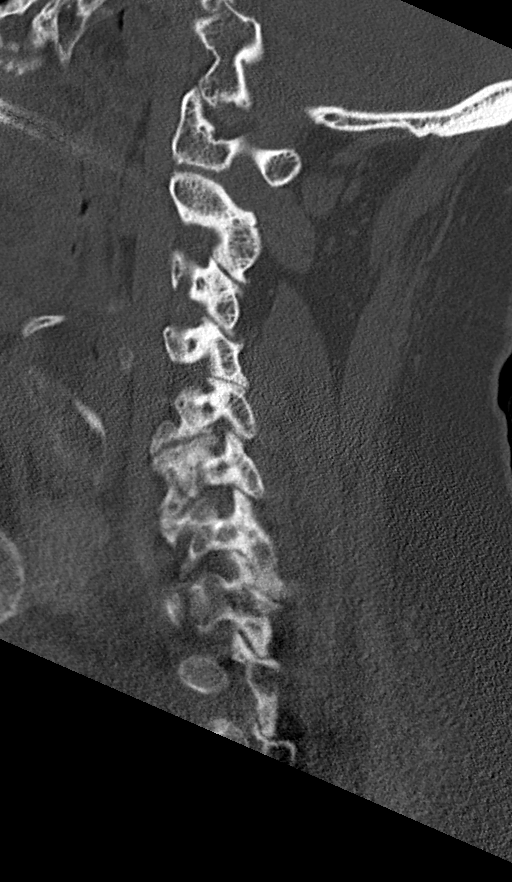
[im 69/104  bone]
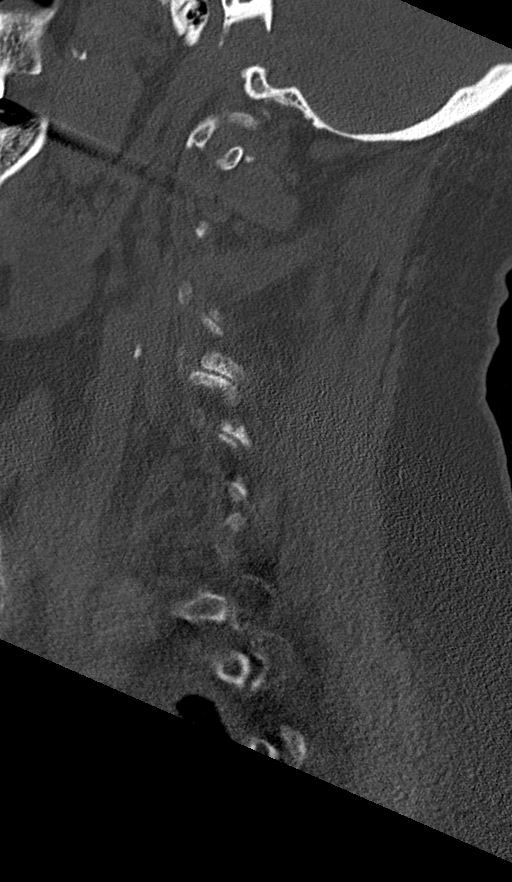

[Series 9: coronal bone · coronal · 0.42mm/px · 3 of 91 slices shown]
[im 27/91  bone]
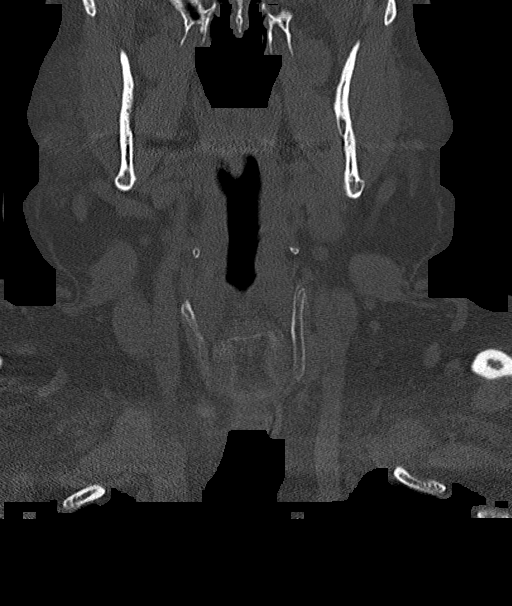
[im 39/91  bone]
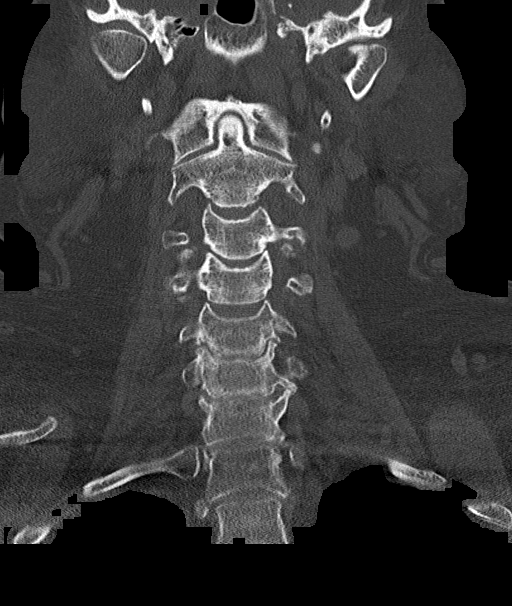
[im 52/91  bone]
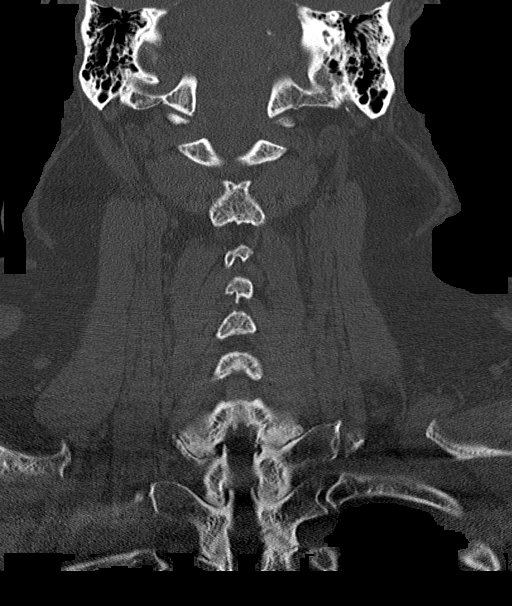

[11 of 33 positions shown; findings below may reference images not displayed]

FINDINGS: Alignment: Straightening of normal lordosis. No traumatic
subluxation.

Skull base and vertebrae: No acute fracture. The dens and skull base
are intact. No evidence of pathologic process.

Soft tissues and spinal canal: Choose spine

Disc levels: Disc space narrowing and endplate spurring most
prominent at C5-C6 and C6-C7. There is multilevel facet hypertrophy.

Upper chest: No acute findings.

Other: Carotid calcifications.
IMPRESSION: 1. No acute fracture or traumatic subluxation of the cervical spine.
2. Multilevel degenerative disc disease and facet hypertrophy.

## 2021-06-29 LAB — AMB POC PVR, MEAS,POST-VOID RES,US,NON-IMAGING
PVR POC: 101 cc
PVR: 101 cc

## 2021-07-17 ENCOUNTER — Ambulatory Visit: Admit: 2021-07-17 | Discharge: 2021-07-17 | Payer: MEDICARE

## 2021-07-17 ENCOUNTER — Encounter: Primary: Student in an Organized Health Care Education/Training Program

## 2021-07-17 DIAGNOSIS — R350 Frequency of micturition: Secondary | ICD-10-CM

## 2021-07-17 DIAGNOSIS — R339 Retention of urine, unspecified: Secondary | ICD-10-CM

## 2021-07-17 LAB — AMB POC URINALYSIS DIP STICK AUTO W/O MICRO
Bilirubin, Urine, POC: NEGATIVE
Blood, Urine, POC: NEGATIVE
Glucose, Urine, POC: NEGATIVE
Ketones, Urine, POC: NEGATIVE
Leukocyte Esterase, Urine, POC: NEGATIVE
Nitrite, Urine, POC: NEGATIVE
Specific Gravity, Urine, POC: 1.02 (ref 1.001–1.035)
Urobilinogen, POC: 0.2
pH, Urine, POC: 5.5 (ref 4.6–8.0)

## 2021-07-17 LAB — AMB POC PVR, MEAS,POST-VOID RES,US,NON-IMAGING: PVR, POC: 1 cc

## 2021-07-17 NOTE — Progress Notes (Signed)
ASSESSMENT:     BPH   On Flomax 0.8 mg and Avodart 0.5 mg daily (started 04/23/21).   UroFlow 03/12/20 - Max flow rate: 17.9 ml/sec, Voided volume: 308 ml.    Currently on Cialis 5 mg daily, previously on Flomax  0.4 mg daily.   ED     Currently on Cialis 5 mg daily.      H/o Elevated PSA  DRE 06/09/18 smooth, no nodules, mildly enlarged.  DRE 04/23/21: 3+ enlarged benign   PSA on 06/03/2018: 2.8  PSA on 11/08/2018: 2.5  Most recent PSA was 2.36 ng/mL on 03/07/2021.     Urinary Retention     PVR 04/23/21: 1,247 cc. After trying to void: 1,113 cc. Catheter placed 04/23/21    Passed VT on 05/09/21         PLAN:    UA today-1+ protein, otherwise, negative  PVR today- 1 cc  Continue Flomax 0.8 mg daily.- Happy to refill, prn  Continue Avodart 0.5 mg daily. Happy to refill, prn  Continue Cialis 5 mg daily. Happy to provide refill   Discussed BPH screening- pt defer at this time  RTC in 6 months with PSA prior    DISCUSSION:  Discussed BPH at length, including problems that can when untreated can lead to  infection, bladder stones, hematuria, incontinence, bladder damage and kidney damage but not limited to these conditions.  Discussed the options for management of LUTS/Benign Prostatic Hyperplasia;  including watchful waiting, medical therapy, and prostate procedures briefly - including Urolift, Rezum, and PVP.    The patient would like to continue on medical therapy.    CC: Follow-up visit for PVR     HISTORY OF PRESENT ILLNESS:  Seth Sandoval is a 66 y.o. male with with a history of BPH, ED, elevated PSA, urinary retention who presents today for 44-month follow-up and a PVR. Today, the patient states he is doing well overall.  His urinary symptoms are as follows:    Urinary Symptoms on Avodart 0.5 mg x daily and Flomax 0.8 mg mg x daily  Nocturia: occasionally 1   Frequency: No             Hesitancy: No  Urgency: No, he can make it to the restroom  Kidney Stone Events: Never  FOS: Good     Otherwise, he is  asymptomatic for infection.  He denies dysuria, flank pain, gross hematuria, F/C/N/V.      PRIOR GU HX:  As per JAR 05/09/21:  Today, the patient reports doing okay. He is interested in repeat a 2nd VT since he failed his most recent one on 04/30/21. He stated he completed a 7-day course of Levaquin  for a recent UTI. He is asymptomatic for infection. Denies flank pain, gross  hematuria, dysuria, or frequency. No f/c/n/v.      As per Dr. Janalyn Rouse 04/23/21:  Patient is having issues voiding today. States on 04/15/21 he was traveling to [place] and had hurt his back. A day after he started having trouble voiding. Denies dysuria.     No flowsheet data found.      Past Medical History:   Diagnosis Date    Back pain     Benign prostatic hyperplasia with incomplete bladder emptying     Benign prostatic hyperplasia with lower urinary tract symptoms     symptom details unspecified    BPH with obstruction/lower urinary tract symptoms     Herniated disc, cervical     High cholesterol  History of urinary retention     Hypertension     Pilonidal cyst     Prostate cancer screening     Urinary retention        No past surgical history on file.    Social History     Tobacco Use    Smoking status: Never    Smokeless tobacco: Never   Substance Use Topics    Alcohol use: Yes    Drug use: No       Allergies   Allergen Reactions    Shellfish Allergy Swelling     Shrimp    Iodine Rash       No family history on file.    Current Outpatient Medications   Medication Sig Dispense Refill    atorvastatin (LIPITOR) 40 MG tablet take 1 tablet by mouth at bedtime for cholesterol      vitamin D (CHOLECALCIFEROL) 25 MCG (1000 UT) TABS tablet Take 1,000 Units by mouth daily      clopidogrel (PLAVIX) 75 MG tablet Take by mouth      cyanocobalamin 100 MCG tablet Take 50 mcg by mouth daily      cyclobenzaprine (FLEXERIL) 10 MG tablet 1 TABLET BY MOUTH THREE TIMES A DAY AS NEEDED MUSCLE RELAXER FOR ACUTE BACK PAIN      dexamethasone (DECADRON) 4 MG  tablet ceived the following from [facility]: Outside name: dexamethasone (DECADRON) 4 mg tablet      dutasteride (AVODART) 0.5 MG capsule Take 0.5 mg by mouth      furosemide (LASIX) 40 MG tablet ceived the following from [facility]: Outside name: furosemide (LASIX) 40 mg tablet      ibuprofen (ADVIL;MOTRIN) 200 MG tablet 200 mg      levothyroxine (SYNTHROID) 25 MCG tablet ceived the following from [facility]: Outside name: levothyroxine (SYNTHROID) 25 mcg tablet      metFORMIN (GLUCOPHAGE) 1000 MG tablet ceived the following from [facility]: Outside name: metFORMIN (GLUCOPHAGE) 1,000 mg tablet      methylPREDNISolone (MEDROL) 4 MG tablet ceived the following from [facility]: Outside name: methylPREDNISolone (MEDROL) 4 mg tab      nystatin (MYCOSTATIN) 100000 UNIT/GM cream nystatin 100,000 unit/gram topical cream      Semaglutide,0.25 or 0.5MG /DOS, (OZEMPIC, 0.25 OR 0.5 MG/DOSE,) 2 MG/1.5ML SOPN ceived the following from [facility]: Outside name: Ozempic 0.25 mg or 0.5 mg/dose (2 mg/1.5 ml) subq pen      simvastatin (ZOCOR) 40 MG tablet 40 mg      tadalafil (CIALIS) 5 MG tablet Take 5 mg by mouth daily      tamsulosin (FLOMAX) 0.4 MG capsule Take 0.8 mg by mouth       No current facility-administered medications for this visit.       Review of Systems   Review of Systems   All other systems reviewed and are negative.     PHYSICAL EXAMINATION:   Constitutional: WDWN, Pleasant  male.No acute distress.     CV:  No peripheral swelling noted   Respiratory: No respiratory distress   GU Male:  (04/23/21)    TSV:[id number] normal to visual inspection. Sphincter with good tone, Rectum with no masses.  Prostate is 3+ benign     SCROTUM:  No scrotal rash or lesions.  Normal bilateral testes and epididymis.    PENIS: Urethral meatus normal in location and size. No urethral  discharge.    05/09/21-Foley draining yellow urine.    Neuro/Psych:  Alert and oriented x 4.    MSK: Ambulatory        REVIEW OF LABS AND IMAGING:    No results found for this visit on 07/17/21.      A copy of today's office visit with all pertinent imaging results and labs were sent to the referring physician.        Saunders Glance, PA-C   [facility]  [address]  [phone]

## 2021-12-09 MED ORDER — TAMSULOSIN HCL 0.4 MG PO CAPS
0.4 MG | ORAL_CAPSULE | ORAL | 1 refills | Status: AC
Start: 2021-12-09 — End: ?

## 2021-12-09 NOTE — Telephone Encounter (Signed)
Patient is requesting a refill for flomax.     Pt is being seen every 6 month(s),was last seen on July 17, 2021.     Per last office note patient is to continue taking the above medication and has a follow-up appointment scheduled for 02/05/2022.    Requested medication has been sent to the pharmacy on file.

## 2021-12-15 NOTE — Progress Notes (Signed)
Formatting of this note is different from the original.  Images from the original note were not included.  Consultation Requested by:Felecia Shelling, MD   Chief Complaint: Other idiopathic peripheral autonomic neuropathy     HPI: Seth Sandoval is a 66 y.o. male c PMHx of T2DM, HLD, hypothyroidism, CAD, BPH and lumbar laminectomy reports to the office in consultation regarding Other idiopathic peripheral autonomic neuropathy.    Per Patient:    Walking with a cane because he has weakness in R>L legs. No one has given him a solid diagnosis. Goes to the gym every day. Was doing PT.  Making some progress, but his frustartion he is going on 2 years and would have thought he would have had more progress than now.     Uses the cane for balance.    Denies numbness, tingling, or pain. Denies back pain. Did a surgery 2 years ago again for weakness no numbness, tingling, or pain.     Per Chart Review:    Hx of T2DM, HTN, HLD, CAD, and lumbar laminectomy   Patient has weakness in BLE, with R foot Drop     Hospitalized 07/22/20-07/25/20 for BLE cellulitis, AKI and dx with DM.   Admitted again 07/30/20-08/07/20 for generalized weakness and leg pain and developed sepsis, metabolic encephalopathy    Lumbar laminectomy in 2021 with foot drop    EMG shows polyneuropathy.    Labs negative Heavy metals, SPEP, Hu, Ri, Yo, Sjogrens, TSH, lyme, B1, B12.    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    There is no problem list on file for this patient.    History reviewed. No pertinent past medical history.    History reviewed. No pertinent surgical history.    Allergies/Adverse Reactions/Intolerances  No Known Allergies    Current Outpatient Medications:     atorvastatin (LIPITOR) 40 MG tablet, Take 1 tablet (40 mg total) by mouth daily., Disp: , Rfl:     celecoxib (CeleBREX) 100 MG capsule, Take 1 capsule (100 mg total) by mouth as needed., Disp: , Rfl:     dutasteride (AVODART) 0.5 mg capsule, Take 1 capsule (0.5 mg total) by mouth  daily., Disp: , Rfl:     furosemide (LASIX) 20 MG tablet, 1 tablet (20 mg total) as needed., Disp: , Rfl:     gabapentin (NEURONTIN) 300 MG capsule, Take 1 capsule (300 mg total) by mouth 3 (three) times daily with meals., Disp: , Rfl:     LEVOthyroxine (SYNTHROID) 25 MCG tablet, Take 1 tablet (25 mcg total) by mouth daily., Disp: , Rfl:     Ozempic 2 mg/dose (8 mg/3 mL) PnIj, 2 mg once a week., Disp: , Rfl:     Plavix 75 mg tablet, 1 tablet (75 mg total) daily., Disp: , Rfl:     tamsulosin (FLOMAX) 0.4 mg Cap 24 hr capsule, 1 capsule (0.4 mg total) daily., Disp: , Rfl:     Social History     Socioeconomic History    Marital status: Married     Spouse name: Not on file    Number of children: Not on file    Years of education: Not on file    Highest education level: Not on file   Occupational History    Not on file   Tobacco Use    Smoking status: Never    Smokeless tobacco: Never   Vaping Use    Vaping Use: Not on file   Substance and Sexual Activity  Alcohol use: Never    Drug use: Never    Sexual activity: Not on file   Other Topics Concern    Not on file   Social History Narrative    Not on file     Social Determinants of Health     Financial Resource Strain: Not on file   Food Insecurity: Not on file   Transportation Needs: Not on file   Physical Activity: Not on file   Stress: Not on file   Social Connections: Not on file   Intimate Partner Violence: Not on file   Housing Stability: Not on file     History reviewed. No pertinent family history.    Vitals:    12/15/21 1337   BP: 146/69   Pulse: 73   Temp: 36.6 C (97.8 F)   Weight: 123.4 kg (272 lb)   Height: 1.854 m (6\' 1" )     General: Pleasant, conversant, Well-kempt  HEENT: NCAT  Cardiovascular: S1S2 present. RRR. No murmurs.  Lungs: Clear to auscultation b/l  Mental Status: Awake alert and oriented x III. Follows commands. No aphasia or dysarthria.Mood is appropriate.  Cranial Nerve: No papilladema. PERRL. EOMI. Facial sensation intact. No facial  asymmetry. Hearing intact. Palate elevates. Uvula midline. SCM strong. No tongue deviation.  Motor: Normal tone. No cogwheel rigidity. No bradykinesia. No pronator drift. 5/5 strength bilaterally in the upper extremities including deltoids, biceps, triceps, wrist extensors/flexors, finger flexors/extensors, interossei, and APB. 5/5 strength bilaterally in the lower extremities including iliopsoas, hamstring, quadriceps, tibialis anterior, gastrocnemius, EHL.  Deep Tendon Reflexes: 2+ in biceps, brachioradialis b/l. 2+ in patellar and ankle jerks. No Babinski. NoHoffman's   Sensory: Intact to soft touch, pinprick, vibration. No neglect.  Cerebellar: Finger to nose intact. Heel to shin intact. Negative Romberg.  Gait: Normal narrow based gait. Toe, Heel, Tandem walking intact.    Imaging:    Assessment and Plan    Burnis was seen today for establish care.    Diagnoses and all orders for this visit:    Lumbosacral radiculopathy  -     Ambulatory referral to Physical Therapy  -     EMG w/Nerve Conduction Study; Future    Other polyneuropathy  -     Ambulatory referral to Physical Therapy  -     EMG w/Nerve Conduction Study; Future     66 y.o. male c PMHx of T2DM, HTN, HLD, hypothyroidism, CAD, BPH and lumbar laminectomy reports to the office in consultation regarding Other idiopathic peripheral autonomic neuropathy.    Patient with R>L lower extremity weakness primarily presenting as R foot drop over the past 2 years. . He denies low back pain, he denies numbness, tingling, or pain in the legs.     He had L5 laminectomy in December 2021. He reports his symptoms have improved minimally and despite PT feels like he has not made as much progress over the last 2 years as he would expect.     He had an MRI which reports multiple lumbar nerve roots being compressed. He has had an EMG of lower extremities which showed peripheral neuropathy and no neuropathy as well as sudomotor testing which showed small fiber neuropathy.  Given patient's edema and cellulitis unlikely the latter two tests are accurate. Technical difficulties due to his anatomy are likely over estimating his neuropathy. I highly doubt he has a small fiber neuropathy with no pain as the hallmark feature of small fiber neuropathy is pain.    His exam shows  absent R knee jerk, absent achilles b/l 3/5 R foot dorsiflexion, 3/5 R EHL, 4/5 L EHL, reduced vibration sense in the toes, but intact pinprick. He has trouble with toe walking, heel walking, and tandem gait. He has a positive romberg.    Based on exam he may have a neuropathy, but likely has super imposed radiculopathy as well. Chronic radiculopathy can also present similarly to     I do not have the images to review myself so have asked patient to upload images to our system.    We will repeat his EMG and refer him to resume physical therapy.    Based on image review and EMG will consider second opinion referral by one of our neurosurgeons.     I personally reviewed the patient's prior notes, labs, and available imaging and agree with reported interpretation unless listed elsewhere.     Patient to follow up after EMG.     I personally spent 55 minutes today, exclusive of procedures, providing care for this patient, including preparation, face to face time, EMR documentation and other services such as review of medical records, diagnostic results, patient education, counseling, coordination of care as specified in the encounter. I also spent 5 minutes on days other than the day of the visit including preparation, EMR documentation and other services such as review of medical records, diagnostic results, and coordination of care as specified in the encounter.      Electronically signed by Trixie Dredge, DO at 12/15/2021  9:41 PM EDT

## 2022-01-29 ENCOUNTER — Encounter

## 2022-02-03 ENCOUNTER — Encounter: Admit: 2022-02-03 | Discharge: 2022-02-03

## 2022-02-03 DIAGNOSIS — R339 Retention of urine, unspecified: Secondary | ICD-10-CM

## 2022-02-03 NOTE — Progress Notes (Signed)
Seth Sandoval presents today for lab draw per Dr. Ashley Jacobs order.   Dr. Louretta Parma was present in the clinic as incident to.     PSA obtained via venipuncture without any difficulty.    Patient will be notified with lab results.       Orders Placed This Encounter   Procedures    Prostate Specific Antigen, Total    PR COLLECTION VENOUS BLOOD VENIPUNCTURE       Lindwood Qua, MA

## 2022-02-04 LAB — PROSTATE SPECIFIC ANTIGEN, TOTAL: PSA: 1.44 ng/mL (ref 0.00–4.00)

## 2022-02-05 ENCOUNTER — Ambulatory Visit: Admit: 2022-02-05 | Discharge: 2022-02-05

## 2022-02-05 DIAGNOSIS — R350 Frequency of micturition: Secondary | ICD-10-CM

## 2022-02-05 LAB — AMB POC URINALYSIS DIP STICK AUTO W/O MICRO
Bilirubin, Urine, POC: NEGATIVE
Blood, Urine, POC: NEGATIVE
Glucose, Urine, POC: NEGATIVE
Ketones, Urine, POC: NEGATIVE
Leukocyte Esterase, Urine, POC: NEGATIVE
Nitrite, Urine, POC: NEGATIVE
Protein, Urine, POC: NEGATIVE
Specific Gravity, Urine, POC: 1.025 (ref 1.001–1.035)
Urobilinogen, POC: 0.2
pH, Urine, POC: 5.5 (ref 4.6–8.0)

## 2022-02-05 LAB — AMB POC PVR, MEAS,POST-VOID RES,US,NON-IMAGING: PVR, POC: 64 cc

## 2022-02-05 MED ORDER — TADALAFIL 5 MG PO TABS
5 MG | ORAL_TABLET | Freq: Every day | ORAL | 3 refills | Status: AC
Start: 2022-02-05 — End: ?

## 2022-02-05 MED ORDER — DUTASTERIDE 0.5 MG PO CAPS
0.5 MG | ORAL_CAPSULE | Freq: Every day | ORAL | 3 refills | Status: AC
Start: 2022-02-05 — End: ?

## 2022-02-05 MED ORDER — TAMSULOSIN HCL 0.4 MG PO CAPS
0.4 MG | ORAL_CAPSULE | ORAL | 4 refills | Status: AC
Start: 2022-02-05 — End: ?

## 2022-02-05 NOTE — Other (Signed)
PSA is 1.44 (2.88 on Avodart).  PSA is stable.

## 2022-02-05 NOTE — Progress Notes (Unsigned)
Precharting    ASSESSMENT:     BPH   On Flomax 0.8 mg and Avodart 0.5 mg daily (started 04/23/21).   UroFlow 03/12/20 - Max flow rate: 17.9 ml/sec, Voided volume: 308 ml.    Currently on Cialis 5 mg daily, previously on Flomax  0.4 mg daily.   ED     Currently on Cialis 5 mg daily.      H/o Elevated PSA  DRE 06/09/18 smooth, no nodules, mildly enlarged.  DRE 04/23/21: 3+ enlarged benign   PSA on 06/03/2018: 2.8  PSA on 11/08/2018: 2.5  Most recent PSA was 2.36 ng/mL on 03/07/2021.     Urinary Retention     PVR 04/23/21: 1,247 cc. After trying to void: 1,113 cc. Catheter placed 04/23/21    Passed VT on 05/09/21         PLAN      OLD:    UA today-1+ protein, otherwise, negative  PVR today- 1 cc  Continue Flomax 0.8 mg daily.- Happy to refill, prn  Continue Avodart 0.5 mg daily. Happy to refill, prn  Continue Cialis 5 mg daily. Happy to provide refill   Discussed BPH screening- pt defer at this time  RTC in 6 months with PSA prior    DISCUSSION:  Discussed BPH at length, including problems that can when untreated can lead to  infection, bladder stones, hematuria, incontinence, bladder damage and kidney damage but not limited to these conditions.  Discussed the options for management of LUTS/Benign Prostatic Hyperplasia;  including watchful waiting, medical therapy, and prostate procedures briefly - including Urolift, Rezum, and PVP.    The patient would like to continue on medical therapy.    CC: Follow-up visit for PVR     HISTORY OF PRESENT ILLNESS:  Seth Sandoval is a 66 y.o. male with with a history of BPH, ED, elevated PSA, urinary retention who presents today for 60-month follow-up and a PVR. Today, the patient states he is doing well overall.  His urinary symptoms are as follows:    Urinary Symptoms on Avodart 0.5 mg x daily and Flomax 0.8 mg mg x daily  Nocturia: occasionally 1   Frequency: No             Hesitancy: No  Urgency: No, he can make it to the restroom  Kidney Stone Events: Never  FOS: Good      Otherwise, he is asymptomatic for infection.  He denies dysuria, flank pain, gross hematuria, F/C/N/V.      PRIOR GU HX:  As per JAR 05/09/21:  Today, the patient reports doing okay. He is interested in repeat a 2nd VT since he failed his most recent one on 04/30/21. He stated he completed a 7-day course of Levaquin  for a recent UTI. He is asymptomatic for infection. Denies flank pain, gross  hematuria, dysuria, or frequency. No f/c/n/v.      As per Dr. Janalyn Rouse 04/23/21:  Patient is having issues voiding today. States on 04/15/21 he was traveling to [place] and had hurt his back. A day after he started having trouble voiding. Denies dysuria.          No data to display                  Past Medical History:   Diagnosis Date    Back pain     Benign prostatic hyperplasia with incomplete bladder emptying     Benign prostatic hyperplasia with lower urinary tract symptoms  symptom details unspecified    BPH with obstruction/lower urinary tract symptoms     Herniated disc, cervical     High cholesterol     History of urinary retention     Hypertension     Pilonidal cyst     Prostate cancer screening     Urinary retention        No past surgical history on file.    Social History     Tobacco Use    Smoking status: Never    Smokeless tobacco: Never   Vaping Use    Vaping Use: Never used   Substance Use Topics    Alcohol use: Yes    Drug use: No       Allergies   Allergen Reactions    Iodinated Contrast Media      Other reaction(s): unknown    Shellfish Allergy Swelling     Shrimp    Shellfish-Derived Products Swelling     Shrimp with iodine    Iodine Rash       No family history on file.    Current Outpatient Medications   Medication Sig Dispense Refill    tamsulosin (FLOMAX) 0.4 MG capsule TAKE TWO CAPSULES BY MOUTH DAILY AFTER DINNER 180 capsule 1    methylPREDNISolone (MEDROL DOSEPACK) 4 MG tablet methylprednisolone 4 mg tablets in a dose pack      nirmatrelvir/ritonavir 300/100 (PAXLOVID) 20 x 150 MG & 10 x  100MG  TBPK Paxlovid 300 mg (150 mg x 2)-100 mg tablets in a dose pack (EUA)   take 1 TABLET OF NIRMATRELVIR 150 MG AND 1 TABLET OF RITONAVIR 100 MG TWICE A DAY FOR 5 DAYS      Multiple Vitamins-Minerals (THERAGRAN-M) TABS Take 1 tablet by mouth daily      gabapentin (NEURONTIN) 300 MG capsule gabapentin 300 mg capsule      ibuprofen (ADVIL;MOTRIN) 800 MG tablet       atorvastatin (LIPITOR) 40 MG tablet take 1 tablet by mouth at bedtime for cholesterol      vitamin D (CHOLECALCIFEROL) 25 MCG (1000 UT) TABS tablet Take 1,000 Units by mouth daily      clopidogrel (PLAVIX) 75 MG tablet Take by mouth      cyanocobalamin 100 MCG tablet Take 50 mcg by mouth daily      dexamethasone (DECADRON) 4 MG tablet ceived the following from [facility]: Outside name: dexamethasone (DECADRON) 4 mg tablet      dutasteride (AVODART) 0.5 MG capsule Take 0.5 mg by mouth      furosemide (LASIX) 40 MG tablet ceived the following from [facility]: Outside name: furosemide (LASIX) 40 mg tablet      levothyroxine (SYNTHROID) 25 MCG tablet ceived the following from [facility]: Outside name: levothyroxine (SYNTHROID) 25 mcg tablet      metFORMIN (GLUCOPHAGE) 1000 MG tablet ceived the following from [facility]: Outside name: metFORMIN (GLUCOPHAGE) 1,000 mg tablet      nystatin (MYCOSTATIN) 100000 UNIT/GM cream nystatin 100,000 unit/gram topical cream      Semaglutide,0.25 or 0.5MG /DOS, (OZEMPIC, 0.25 OR 0.5 MG/DOSE,) 2 MG/1.5ML SOPN ceived the following from [facility]: Outside name: Ozempic 0.25 mg or 0.5 mg/dose (2 mg/1.5 ml) subq pen      simvastatin (ZOCOR) 40 MG tablet 40 mg      tadalafil (CIALIS) 5 MG tablet Take 5 mg by mouth daily  No current facility-administered medications for this visit.       Review of Systems   Review of Systems   All other systems reviewed and are negative.       PHYSICAL EXAMINATION:   Constitutional: WDWN, Pleasant  male.No  acute distress.     CV:  No peripheral swelling noted   Respiratory: No respiratory distress   GU Male:  (04/23/21)    XBM:[id number] normal to visual inspection. Sphincter with good tone, Rectum with no masses.  Prostate is 3+ benign     SCROTUM:  No scrotal rash or lesions.  Normal bilateral testes and epididymis.    PENIS: Urethral meatus normal in location and size. No urethral discharge.    05/09/21-Foley draining yellow urine.   Neuro/Psych:  Alert and oriented x 4.    MSK: Ambulatory        REVIEW OF LABS AND IMAGING:    No results found for this visit on 02/05/22.      A copy of today's office visit with all pertinent imaging results and labs were sent to the referring physician.        Saunders Glance, PA-C   [facility]  [address]  [phone]

## 2022-02-20 LAB — HEMOGLOBIN A1C
Estimated Avg Glucose, External: 123 mg/dL (ref 91–123)
Hemoglobin A1C, External: 5.9 % — ABNORMAL HIGH (ref 4.8–5.6)

## 2022-07-10 LAB — HEMOGLOBIN A1C
Estimated Avg Glucose, External: 112 mg/dL (ref 91–123)
Hemoglobin A1C, External: 5.5 % (ref 4.8–5.6)

## 2023-02-03 ENCOUNTER — Encounter

## 2023-02-14 LAB — HEMOGLOBIN A1C
Estimated Avg Glucose, External: 97 mg/dL (ref 91–123)
Hemoglobin A1C, External: 5 % (ref 4.8–5.6)

## 2023-02-18 NOTE — Progress Notes (Deleted)
 Seth Sandoval  Sep 25, 1955       ASSESSMENT:   1. BPH W/ LUTS with {Storage or voiding:74113}  - PVR: *** cc today, reviewed with patient  - UA today: *** neg blood, neg nit, neg LE   On Flomax  0.8 mg and Avodart  0.5 mg daily (started 04/23/21).   UroFlow 03/12/20 - Max flow rate: 17.9 ml/sec,    Voided volume: 308 ml.    Currently on Cialis  5 mg daily, previously on Flomax   0.4 mg daily.   - Cysto/size: ***  - Symptom(s): {LUTS symptoms:74510}     2. ED, likely cause {ED Options:74051}  - Testosterone Level: ***  - Testosterone therapy: ***  - Nitrate use at home: {YES/NO:19726}  - Symptoms: ***  - Prior Treatments:  Currently on Cialis  5 mg daily.                  Elevated PSA  - PSA Trend:              PSA on 02/13/23:     1.3 ng/ml              PSA on 11/08/2018: 2.5 ng/mL   PSA on 03/07/21: 2.36 ng/mL      Lab Results   Component Value Date    PSA 1.44 02/03/2022    PSA 2.80 06/03/2018   - DRE: ***              DRE 06/09/18 smooth, no nodules, mildly enlarged.  DRE 04/23/21: 3+ enlarged benign   - Prior Biopsy: ***  - Family History: ***     4. Urinary Retention               PVR 04/23/21: 1,247 cc. After trying to void: 1,113 cc.               Catheter placed 04/23/21              Passed VT on 05/09/21    ***. Chronic medical conditions: HTN, HLD  - Cr: 1.0, GFR: >60 (02/13/23)  - Blood thinners: ***  - PSH: ***    ***. Tobacco use: *** packs/day x *** years  - Discussed cessation or at least cutting back    Ongoing longitudinal total patient care is being provided for the above chronic condition(s) documented in this note.     PLAN:    - ***  - RTC in ***    DISCUSSION:  ***    This discussion was created using Conservation officer, historic buildings / speech to text program. Although reasonable attempts were made to ensure accuracy, there may be unintentional errors as a result of incorrect voice recognition / transcription which were not appreciated and corrected at the end of this visit and completion of this  note.    No chief complaint on file.      HISTORY OF PRESENT ILLNESS:  Seth Sandoval is a 67 y.o. male who follows up today for the above.    Patient presents to clinic today.    REVIEW OF LABS & IMAGING:  Reviewed, as per assessment.    Past Medical History:   Diagnosis Date    Back pain     Benign prostatic hyperplasia with incomplete bladder emptying     Benign prostatic hyperplasia with lower urinary tract symptoms     symptom details unspecified    BPH with obstruction/lower urinary tract symptoms     Herniated disc, cervical  High cholesterol     History of urinary retention     Hypertension     Pilonidal cyst     Prostate cancer screening     Urinary retention        No past surgical history on file.    Social History     Tobacco Use    Smoking status: Never    Smokeless tobacco: Never   Vaping Use    Vaping status: Never Used   Substance Use Topics    Alcohol use: Yes    Drug use: No       Allergies   Allergen Reactions    Iodinated Contrast Media Swelling     Other reaction(s): unknown  Iodine from shrimp    Shellfish Allergy Swelling     Shrimp    Shellfish-Derived Products Swelling     Shrimp with iodine    Iodine Rash       No family history on file.    Current Outpatient Medications   Medication Sig Dispense Refill    OZEMPIC, 2 MG/DOSE, 8 MG/3ML SOPN       celecoxib (CELEBREX) 100 MG capsule Take 1 capsule by mouth daily (with breakfast)      tadalafil  (CIALIS ) 5 MG tablet Take 1 tablet by mouth daily 90 tablet 3    dutasteride  (AVODART ) 0.5 MG capsule Take 1 capsule by mouth daily 90 capsule 3    tamsulosin  (FLOMAX ) 0.4 MG capsule TAKE TWO CAPSULES BY MOUTH DAILY AFTER DINNER 180 capsule 4    Multiple Vitamins-Minerals (THERAGRAN-M) TABS Take 1 tablet by mouth daily      gabapentin (NEURONTIN) 300 MG capsule gabapentin 300 mg capsule      ibuprofen (ADVIL;MOTRIN) 800 MG tablet       vitamin D (CHOLECALCIFEROL) 25 MCG (1000 UT) TABS tablet Take 1 tablet by mouth daily      clopidogrel (PLAVIX) 75  MG tablet Take by mouth      cyanocobalamin 100 MCG tablet Take 0.5 tablets by mouth daily      furosemide (LASIX) 40 MG tablet ceived the following from [facility]: Outside name: furosemide (LASIX) 40 mg tablet      levothyroxine (SYNTHROID) 25 MCG tablet ceived the following from [facility]: Outside name: levothyroxine (SYNTHROID) 25 mcg tablet      simvastatin (ZOCOR) 40 MG tablet 1 tablet       No current facility-administered medications for this visit.       PHYSICAL EXAMINATION:   There were no vitals taken for this visit.  Constitutional: No acute distress.    CV:  No significant peripheral edema, regular rate  Respiratory: No respiratory distress, no audible wheezes.    LABS:    No results found for this or any previous visit (from the past 12 hour(s)).     No results found for: LABCYST, URVOLPR, CAOXSAT, LABCALC, OXAU, CITRUR, CAPHOSSAT, LABPH, URICACSAT, URICUR, NAUR, LABPOTA, MAGU, PHOSUR, AMMONIUMUR, LABCHLO, SULFATEURINE, UREAUR, PROTCATABRTU, LABCREA, CREATKGBW, CAKGBW, CACREAT]    Lab Results   Component Value Date    PSA 1.44 02/03/2022    PSA 2.80 06/03/2018       A copy of today's office visit with all pertinent imaging results and labs were sent to the referring physician.        Jordan Goldwag, MD      [address]  [place], [place] [address]  [phone] Office  [phone] Pager  Medical documentation is done with the assistance of Elfida Natal, medical scribe for Jordan Goldwag, MD on 02/17/2023

## 2023-03-18 ENCOUNTER — Encounter: Attending: Student in an Organized Health Care Education/Training Program

## 2023-03-18 NOTE — Progress Notes (Unsigned)
Seth Sandoval  1956/04/11       ASSESSMENT:   1. BPH  - On Flomax 0.8 mg and Avodart 0.5 mg daily (started 04/23/21).  - UroFlow 03/12/20 - Max flow rate: 17.9 ml/sec, Voided volume: 308 ml.   - Currently on Cialis 5 mg daily, previously on Flomax  0.4 mg daily.   - Symptoms Occasional nocturia    2. ED   - Currently on Cialis 5 mg daily.      3. H/o Elevated PSA             PSA on 02/13/23: 1.330 ng/ml             PSA on 02/04/22: 1.44 ng/mL (Avodart 2.88 ng/mL)  PSA on 06/03/2018: 2.8 ng/mL   PSA on 11/08/2018: 2.5 ng/mL   PSA on 03/07/21: 2.36 ng/mL   - DRE 06/09/18 smooth, no nodules, mildly enlarged.  - DRE 04/23/21: 3+ enlarged benign     4. Urinary Retention   - PVR 04/23/21: 1,247 cc. After trying to void: 1,113 cc. Catheter placed 04/23/21  - Passed VT on 05/09/21    ***. Chronic medical conditions: HDL, HTN  - Cr: 1.0, GFR: >16 (02/13/23)  - Blood thinners: None  - PSH: ***    ***. Tobacco use: *** packs/day x *** years  - Discussed cessation or at least cutting back    Ongoing longitudinal total patient care is being provided for the above chronic condition(s) documented in this note.     PLAN:    - ***  - RTC in ***    DISCUSSION:  ***    This discussion was created using Conservation officer, historic buildings / speech to text program. Although reasonable attempts were made to ensure accuracy, there may be unintentional errors as a result of incorrect voice recognition / transcription which were not appreciated and corrected at the end of this visit and completion of this note.    No chief complaint on file.      HISTORY OF PRESENT ILLNESS:  Seth Sandoval is a 67 y.o. male who follows up today for the above.    Patient presents to clinic today.    REVIEW OF LABS & IMAGING:  Reviewed, as per assessment.    Past Medical History:   Diagnosis Date    Back pain     Benign prostatic hyperplasia with incomplete bladder emptying     Benign prostatic hyperplasia with lower urinary tract symptoms     symptom details  unspecified    BPH with obstruction/lower urinary tract symptoms     Herniated disc, cervical     High cholesterol     History of urinary retention     Hypertension     Pilonidal cyst     Prostate cancer screening     Urinary retention        No past surgical history on file.    Social History     Tobacco Use    Smoking status: Never    Smokeless tobacco: Never   Vaping Use    Vaping status: Never Used   Substance Use Topics    Alcohol use: Yes    Drug use: No       Allergies   Allergen Reactions    Iodinated Contrast Media Swelling     Other reaction(s): unknown  Iodine from shrimp    Shellfish Allergy Swelling     Shrimp    Shellfish-Derived Products Swelling  Shrimp with iodine    Iodine Rash       No family history on file.    Current Outpatient Medications   Medication Sig Dispense Refill    OZEMPIC, 2 MG/DOSE, 8 MG/3ML SOPN       celecoxib (CELEBREX) 100 MG capsule Take 1 capsule by mouth daily (with breakfast)      tadalafil (CIALIS) 5 MG tablet Take 1 tablet by mouth daily 90 tablet 3    dutasteride (AVODART) 0.5 MG capsule Take 1 capsule by mouth daily 90 capsule 3    tamsulosin (FLOMAX) 0.4 MG capsule TAKE TWO CAPSULES BY MOUTH DAILY AFTER DINNER 180 capsule 4    Multiple Vitamins-Minerals (THERAGRAN-M) TABS Take 1 tablet by mouth daily      gabapentin (NEURONTIN) 300 MG capsule gabapentin 300 mg capsule      ibuprofen (ADVIL;MOTRIN) 800 MG tablet       vitamin D (CHOLECALCIFEROL) 25 MCG (1000 UT) TABS tablet Take 1 tablet by mouth daily      clopidogrel (PLAVIX) 75 MG tablet Take by mouth      cyanocobalamin 100 MCG tablet Take 0.5 tablets by mouth daily      furosemide (LASIX) 40 MG tablet ceived the following from [facility]: Outside name: furosemide (LASIX) 40 mg tablet      levothyroxine (SYNTHROID) 25 MCG tablet ceived the following from [facility]: Outside name: levothyroxine (SYNTHROID) 25 mcg tablet      simvastatin (ZOCOR) 40 MG tablet 1 tablet       No  current facility-administered medications for this visit.       PHYSICAL EXAMINATION:   There were no vitals taken for this visit.  Constitutional: No acute distress.    CV:  No significant peripheral edema, regular rate  Respiratory: No respiratory distress, no audible wheezes.    LABS:    No results found for this or any previous visit (from the past 12 hour(s)).     No results found for: "LABCYST", "URVOLPR", "CAOXSAT", "LABCALC", "OXAU", "CITRUR", "CAPHOSSAT", "LABPH", "URICACSAT", "URICUR", "NAUR", "LABPOTA", "MAGU", "PHOSUR", "AMMONIUMUR", "LABCHLO", "SULFATEURINE", "UREAUR", "PROTCATABRTU", "LABCREA", "CREATKGBW", "CAKGBW", "CACREAT"]    Lab Results   Component Value Date    PSA 1.44 02/03/2022    PSA 2.80 06/03/2018       A copy of today's office visit with all pertinent imaging results and labs were sent to the referring physician.        [place] Goldwag, MD      [address]  [place], [place] [address]  [phone] Office  [phone] Pager    Medical documentation is done with the assistance of Dennie Fetters, medical scribe for [place] Goldwag, MD on 03/17/2023

## 2023-05-03 ENCOUNTER — Encounter: Admit: 2023-05-03 | Payer: MEDICARE | Admitting: Student in an Organized Health Care Education/Training Program

## 2023-05-03 VITALS — Resp 20 | Ht 73.0 in | Wt 272.0 lb

## 2023-05-03 DIAGNOSIS — N401 Enlarged prostate with lower urinary tract symptoms: Principal | ICD-10-CM

## 2023-05-03 DIAGNOSIS — R350 Frequency of micturition: Secondary | ICD-10-CM

## 2023-05-03 LAB — AMB POC URINALYSIS DIP STICK AUTO W/O MICRO
Bilirubin, Urine, POC: NEGATIVE
Blood, Urine, POC: NEGATIVE
Glucose, Urine, POC: NEGATIVE
Ketones, Urine, POC: NEGATIVE
Leukocyte Esterase, Urine, POC: NEGATIVE
Nitrite, Urine, POC: NEGATIVE
Protein, Urine, POC: NEGATIVE
Specific Gravity, Urine, POC: 1.01 (ref 1.001–1.035)
Urobilinogen, POC: 0.2
pH, Urine, POC: 5 (ref 4.6–8.0)

## 2023-05-03 LAB — AMB POC PVR, MEAS,POST-VOID RES,US,NON-IMAGING: PVR, POC: 0 mL

## 2023-05-03 NOTE — Progress Notes (Signed)
 Seth Sandoval  04/30/1956       ASSESSMENT:   1. BPH/LUTS  - Patient reports doing well, happy with symptoms on medications  -  On Flomax 0.8 mg and Avodart 0.5 mg daily (started 04/23/21).  -  UroFlow 03/12/20 - Max flow rate: 17.9 ml/sec, Voided volum

## 2023-09-20 LAB — HEMOGLOBIN A1C
Estimated Avg Glucose, External: 91 mg/dL (ref 91–123)
Hemoglobin A1C, External: 4.8 % (ref 4.8–5.6)

## 2024-04-10 ENCOUNTER — Ambulatory Visit
Admit: 2024-04-10 | Discharge: 2024-04-10 | Payer: MEDICARE | Attending: Student in an Organized Health Care Education/Training Program

## 2024-04-10 DIAGNOSIS — N401 Enlarged prostate with lower urinary tract symptoms: Principal | ICD-10-CM

## 2024-04-10 LAB — AMB POC URINALYSIS DIP STICK AUTO W/O MICRO
Bilirubin, Urine, POC: NEGATIVE
Blood, Urine, POC: NEGATIVE
Glucose, Urine, POC: NEGATIVE
Ketones, Urine, POC: NEGATIVE
Leukocyte Esterase, Urine, POC: NEGATIVE
Nitrite, Urine, POC: NEGATIVE
Specific Gravity, Urine, POC: 1.025 (ref 1.001–1.035)
Urobilinogen, POC: 0.2
pH, Urine, POC: 6 (ref 4.6–8.0)

## 2024-04-10 LAB — AMB POC PVR, MEAS,POST-VOID RES,US,NON-IMAGING: PVR, POC: 14 cc

## 2024-04-10 NOTE — Progress Notes (Signed)
 "    Seth Sandoval  1956-02-01     Patient was last seen by Eldra Word on 05/03/23      ASSESSMENT:   1. BPH/LUTS  - Patient reports doing well overall since last visit. He is happy with symptoms on GU medications. No gross hematuria or issues with UTIs. Patient reports he had laminectomy procedure, after which he has now recovered  -  On Flomax  0.8 mg and Avodart  0.5 mg daily, Cialis  (started 04/23/21).  -Patient has these medications refilled by his concierge physician  -  UroFlow 03/12/20 - Max flow rate: 17.9 ml/sec, Voided volume: 308 ml.   - Previously Cialis  5 mg daily, not currently taking  - PVR: 14 cc today, reviewed with patient  - UA today: Neg blood, neg nit, neg LE, reviewed  - Cysto/size: Never     2. Elevated PSA  - Managed by PCP  - PSA Trend:  PSA on 06/03/2018: 2.8 ng/mL   PSA on 11/08/2018: 2.5 ng/mL   PSA on 03/07/21: 2.36 ng/mL               PSA on 02/04/22: 1.44 ng/mL (Avodart  2.88 ng/mL)              PSA on 02/13/23: 1.33 ng/ml              PSA on 09/20/23: 1.510 ng/ml  - DRE 06/09/18 smooth, no nodules, mildly enlarged.  - DRE 04/23/21: 3+ enlarged benign   - Prior Biopsy: None  - Family History: None   - Testosterone 09/20/23: 606      3. History of urinary Retention   -  PVR 04/23/21: 1,247 cc. After trying to void: 1,113 cc. Catheter placed 04/23/21  -  Passed VT on 05/09/21  -Emptying bladder well, PVR 14 cc today     4. Chronic medical conditions: HTN, HLD, hematoma  - Cr: 1.2, GFR: > 60 (09/20/23)  - Blood thinners: Plavix 75 mg  - PSH: None    Ongoing longitudinal total patient care is being provided for the above chronic condition(s) documented in this note.     PLAN:    - PSA from 08/2023 reviewed with patient   - Continue Avodart  0.5 mg and Flomax  0.8 mg daily, refills per PCP  - Continue Cialis    - Continue yearly PSA checks with PCP  - RTC in 1 year per patient preference with PVR at next visit    DISCUSSION:  I reviewed with patient that he is emptying his bladder well.  He seems to be  doing well symptomatically on Flomax , Avodart , Cialis .  Patient has these prescribed by his concierge GP.  I discussed with patient option to follow-up with urology as needed for now.  Patient would like to continue postvoid residual checks yearly and symptom checks with urology for now.    Regarding patient's PSA levels, I reviewed his recent PSA check with PSA remaining very low and stable even after correcting for being on 5 alpha reductase inhibitor.  Patient would like to continue PSA checks with PCP yearly for now.    This discussion was created using Conservation officer, historic buildings / speech to text program. Although reasonable attempts were made to ensure accuracy, there may be unintentional errors as a result of incorrect voice recognition / transcription which were not appreciated and corrected at the end of this visit and completion of this note.    Chief Complaint   Patient presents with  Benign Prostatic Hypertrophy       HISTORY OF PRESENT ILLNESS:  Seth Sandoval is a 68 y.o. male who follows up today for the above.    Patient presents to clinic today.    REVIEW OF LABS & IMAGING:  Reviewed, as per assessment.    Past Medical History:   Diagnosis Date    Back pain     Benign prostatic hyperplasia with incomplete bladder emptying     Benign prostatic hyperplasia with lower urinary tract symptoms     symptom details unspecified    BPH with obstruction/lower urinary tract symptoms     Hematoma 10/2022    Subdermal hematoma    Herniated disc, cervical     High cholesterol     History of urinary retention     Hypertension     Pilonidal cyst     Prostate cancer screening     Urinary retention        Past Surgical History:   Procedure Laterality Date    LAMINECTOMY         Social History     Tobacco Use    Smoking status: Never    Smokeless tobacco: Never   Vaping Use    Vaping status: Never Used   Substance Use Topics    Alcohol use: Not Currently    Drug use: No       Allergies   Allergen Reactions     Iodinated Contrast Media Swelling     Other reaction(s): unknown  Iodine from shrimp    Shellfish Allergy Swelling     Shrimp    Shellfish Protein-Containing Drug Products Swelling     Shrimp with iodine    Shrimp (Diagnostic)      Other Reaction(s): hives ( ONLY [place])    Iodine Rash       No family history on file.    Current Outpatient Medications   Medication Sig Dispense Refill    Tadalafil  (CIALIS  PO)       aspirin 81 MG EC tablet Take 1 tablet by mouth daily      amLODIPine (NORVASC) 2.5 MG tablet Take 1 tablet by mouth 2 times daily      Ascorbic Acid (VITAMIN C) 500 MG CHEW 2 tab Orally Once a day      atorvastatin (LIPITOR) 20 MG tablet Take 2 tablets by mouth daily      Omega-3 Fatty Acids (FISH OIL) 1000 MG capsule Take by mouth daily      Tirzepatide (MOUNJARO) 2.5 MG/0.5ML SOAJ Inject 10 mg into the skin      dutasteride  (AVODART ) 0.5 MG capsule Take 1 capsule by mouth daily 90 capsule 3    tamsulosin  (FLOMAX ) 0.4 MG capsule TAKE TWO CAPSULES BY MOUTH DAILY AFTER DINNER 180 capsule 4    Multiple Vitamins-Minerals (THERAGRAN-M) TABS Take 1 tablet by mouth daily      ibuprofen (ADVIL;MOTRIN) 800 MG tablet       vitamin D (CHOLECALCIFEROL) 25 MCG (1000 UT) TABS tablet Take 1 tablet by mouth daily      cyanocobalamin 100 MCG tablet Take 0.5 tablets by mouth daily      levothyroxine (SYNTHROID) 25 MCG tablet ceived the following from [facility]: Outside name: levothyroxine (SYNTHROID) 25 mcg tablet       No current facility-administered medications for this visit.       PHYSICAL EXAMINATION:   Resp 16   Ht 1.854 m (6' 1)  Wt 104.3 kg (230 lb)   BMI 30.34 kg/m   Constitutional: No acute distress.    CV:  No significant peripheral edema, regular rate  Respiratory: No respiratory distress, no audible wheezes.    LABS:    Recent Results (from the past 12 hours)   AMB POC PVR, MEAS,POST-VOID RES,US ,NON-IMAGING    Collection Time: 04/10/24  1:20 PM   Result Value Ref Range     PVR, POC 14 cc        No results found for: LABCYST, URVOLPR, CAOXSAT, LABCALC, OXAU, CITRUR, CAPHOSSAT, LABPH, URICACSAT, URICUR, NAUR, LABPOTA, MAGU, PHOSUR, AMMONIUMUR, LABCHLO, SULFATEURINE, UREAUR, PROTCATABRTU, LABCREA, CREATKGBW, CAKGBW, CACREAT]    Lab Results   Component Value Date    PSA 1.44 02/03/2022    PSA 2.80 06/03/2018       A copy of today's office visit with all pertinent imaging results and labs were sent to the referring physician.        Natania Finigan, MD      [address]  [place], [place] [address]  [phone] Office  [phone] Pager      ICD-10-CM    1. Benign prostatic hyperplasia with urinary frequency  N40.1 AMB POC URINALYSIS DIP STICK AUTO W/O MICRO    R35.0 AMB POC PVR, MEAS,POST-VOID RES,US ,NON-IMAGING      2. Feeling of incomplete bladder emptying  R39.14 AMB POC URINALYSIS DIP STICK AUTO W/O MICRO     AMB POC PVR, MEAS,POST-VOID RES,US ,NON-IMAGING      3. Prostate cancer screening  Z12.5           Medical documentation is done with the assistance of Elfida Natal, medical scribe for Stanislaus Kaltenbach, MD on 04/10/2024  "
